# Patient Record
Sex: Female | Born: 1970
Health system: Southern US, Community
[De-identification: ages and names within clinical notes are randomized; demographics above are authoritative.]

## PROBLEM LIST (undated history)

## (undated) DIAGNOSIS — N6320 Unspecified lump in the left breast, unspecified quadrant: Secondary | ICD-10-CM

## (undated) DIAGNOSIS — K2100 Gastro-esophageal reflux disease with esophagitis, without bleeding: Secondary | ICD-10-CM

## (undated) DIAGNOSIS — N951 Menopausal and female climacteric states: Secondary | ICD-10-CM

## (undated) DIAGNOSIS — M1990 Unspecified osteoarthritis, unspecified site: Secondary | ICD-10-CM

## (undated) DIAGNOSIS — I10 Essential (primary) hypertension: Secondary | ICD-10-CM

## (undated) DIAGNOSIS — Z8719 Personal history of other diseases of the digestive system: Secondary | ICD-10-CM

## (undated) DIAGNOSIS — D352 Benign neoplasm of pituitary gland: Secondary | ICD-10-CM

## (undated) HISTORY — DX: Benign neoplasm of pituitary gland: D35.2

## (undated) HISTORY — DX: Unspecified osteoarthritis, unspecified site: M19.90

## (undated) HISTORY — DX: Unspecified lump in the left breast, unspecified quadrant: N63.20

## (undated) HISTORY — PX: LAPAROSCOPIC GASTRIC BANDING: SHX1100

## (undated) HISTORY — DX: Gastro-esophageal reflux disease with esophagitis, without bleeding: K21.00

## (undated) HISTORY — DX: Essential (primary) hypertension: I10

## (undated) HISTORY — PX: TUBAL LIGATION: SHX77

## (undated) HISTORY — PX: TONSILLECTOMY AND ADENOIDECTOMY: SUR1326

## (undated) HISTORY — DX: Personal history of other diseases of the digestive system: Z87.19

## (undated) HISTORY — PX: CERVICAL BIOPSY  W/ LOOP ELECTRODE EXCISION: SUR135

## (undated) HISTORY — DX: Menopausal and female climacteric states: N95.1

---

## 2010-02-19 HISTORY — PX: LAPAROSCOPIC GASTRIC BANDING: SHX1100

## 2013-03-24 DIAGNOSIS — I10 Essential (primary) hypertension: Secondary | ICD-10-CM | POA: Insufficient documentation

## 2013-03-24 DIAGNOSIS — D259 Leiomyoma of uterus, unspecified: Secondary | ICD-10-CM | POA: Insufficient documentation

## 2013-03-24 DIAGNOSIS — A6004 Herpesviral vulvovaginitis: Secondary | ICD-10-CM | POA: Insufficient documentation

## 2017-02-19 HISTORY — PX: CERVICAL BIOPSY  W/ LOOP ELECTRODE EXCISION: SUR135

## 2018-02-26 DIAGNOSIS — E559 Vitamin D deficiency, unspecified: Secondary | ICD-10-CM | POA: Insufficient documentation

## 2018-02-26 DIAGNOSIS — D352 Benign neoplasm of pituitary gland: Secondary | ICD-10-CM | POA: Insufficient documentation

## 2018-03-12 DIAGNOSIS — Z9884 Bariatric surgery status: Secondary | ICD-10-CM | POA: Insufficient documentation

## 2018-10-29 DIAGNOSIS — M25562 Pain in left knee: Secondary | ICD-10-CM | POA: Insufficient documentation

## 2018-10-29 DIAGNOSIS — M170 Bilateral primary osteoarthritis of knee: Secondary | ICD-10-CM | POA: Insufficient documentation

## 2018-10-29 DIAGNOSIS — M25561 Pain in right knee: Secondary | ICD-10-CM | POA: Insufficient documentation

## 2020-04-27 ENCOUNTER — Other Ambulatory Visit: Payer: Self-pay

## 2020-04-28 ENCOUNTER — Other Ambulatory Visit: Payer: Self-pay

## 2020-04-28 ENCOUNTER — Other Ambulatory Visit (HOSPITAL_COMMUNITY): Payer: Self-pay | Admitting: Podiatry

## 2020-04-28 ENCOUNTER — Ambulatory Visit (INDEPENDENT_AMBULATORY_CARE_PROVIDER_SITE_OTHER): Payer: 59

## 2020-04-28 ENCOUNTER — Ambulatory Visit: Payer: 59 | Admitting: Podiatry

## 2020-04-28 DIAGNOSIS — M79671 Pain in right foot: Secondary | ICD-10-CM

## 2020-04-28 DIAGNOSIS — M7731 Calcaneal spur, right foot: Secondary | ICD-10-CM

## 2020-04-28 DIAGNOSIS — M722 Plantar fascial fibromatosis: Secondary | ICD-10-CM

## 2020-04-28 MED ORDER — MELOXICAM 15 MG PO TABS: 15.0000 mg | ORAL_TABLET | Freq: Every day | ORAL | 3 refills | Status: DC

## 2020-04-28 MED FILL — MELOXICAM 15 MG TABLET: 15 | 30 days supply | Qty: 30 | Fill #0

## 2020-04-29 DIAGNOSIS — M79671 Pain in right foot: Secondary | ICD-10-CM | POA: Diagnosis not present

## 2020-04-29 DIAGNOSIS — M722 Plantar fascial fibromatosis: Secondary | ICD-10-CM | POA: Diagnosis not present

## 2020-04-29 DIAGNOSIS — M7731 Calcaneal spur, right foot: Secondary | ICD-10-CM | POA: Diagnosis not present

## 2020-04-29 MED ORDER — TRIAMCINOLONE ACETONIDE 40 MG/ML IJ SUSP
10.0000 mg | Freq: Once | INTRAMUSCULAR | Status: AC
Start: 2020-04-29 — End: 2020-04-29
  Administered 2020-04-29: 10 mg

## 2020-04-29 MED ORDER — DEXAMETHASONE SODIUM PHOSPHATE 4 MG/ML IJ SOLN
4.0000 mg | Freq: Once | INTRAMUSCULAR | Status: AC
  Administered 2020-04-29: 4 mg

## 2020-04-29 NOTE — Progress Notes (Signed)
  Subjective:  Patient ID: Alice Silva, female    DOB: 10-13-1970,  MRN: [id number]  Chief Complaint  Patient presents with  . Foot Pain    Right heel pain more on the lateral side of her foot. PT stated that she has had this pain since January it is a constant pain that is sharp and dull and achy     50 y.o. female presents with the above complaint. History confirmed with patient.  She works as a Marine scientist at [employer].  The pain in the right heel started on the lateral side of the foot since January.  Describes it as sharp pain while walking.  She has had plantar fasciitis for does not feel like this is the same thing.  Objective:  Physical Exam: warm, good capillary refill, no trophic changes or ulcerative lesions, normal DP and PT pulses and normal sensory exam.   Right Foot: She has pain on palpation to the plantar lateral calcaneal tubercle at the insertion of the lateral band of the plantar fascia, no pain in the mid arch, no posterior heel pain, no pain with calcaneal squeeze.   Radiographs: X-ray of the right foot: no fracture, dislocation, swelling or degenerative changes noted, plantar calcaneal spur and posterior calcaneal spur Assessment:   1. Pain of right heel   2. Plantar fasciitis of right foot   3. Calcaneal spur of right foot      Plan:  Patient was evaluated and treated and all questions answered.  Discussed the etiology and treatment options for plantar fasciitis including stretching, formal physical therapy, supportive shoegears such as a running shoe or sneaker, pre fabricated orthoses, injection therapy, and oral medications. We also discussed the role of surgical treatment of this for patients who do not improve after exhausting non-surgical treatment options.  Her plantar fasciitis symptoms are not typical though I do think this is the most likely diagnosis on the lateral band insertion on the calcaneus.  It is possible that she is experiencing significant bony edema  within the large spur that she has.  Low threshold to order MRI at next visit if she has not had improvement.   -XR reviewed with patient -Educated patient on stretching and icing of the affected limb -Plantar fascial brace dispensed -Injection delivered to the plantar fascia of the right foot. -Rx for meloxicam. Educated on use, risks and benefits of the medication.  She has had issues with a blood pressure with NSAIDs before I cautioned her to watch for this.  Return in about 4 weeks (around 05/26/2020) for re-check right heel pain.

## 2020-05-26 DIAGNOSIS — N920 Excessive and frequent menstruation with regular cycle: Secondary | ICD-10-CM | POA: Diagnosis not present

## 2020-05-26 DIAGNOSIS — Z7689 Persons encountering health services in other specified circumstances: Secondary | ICD-10-CM | POA: Diagnosis not present

## 2020-06-02 ENCOUNTER — Ambulatory Visit: Payer: 59 | Admitting: Podiatry

## 2020-06-02 ENCOUNTER — Encounter: Payer: Self-pay | Admitting: Podiatry

## 2020-06-02 ENCOUNTER — Other Ambulatory Visit: Payer: Self-pay

## 2020-06-02 DIAGNOSIS — M7731 Calcaneal spur, right foot: Secondary | ICD-10-CM | POA: Diagnosis not present

## 2020-06-02 DIAGNOSIS — M722 Plantar fascial fibromatosis: Secondary | ICD-10-CM | POA: Diagnosis not present

## 2020-06-02 NOTE — Patient Instructions (Signed)
Continue the stretching exercises. Take motrin as needed

## 2020-06-02 NOTE — Progress Notes (Signed)
  Subjective:  Patient ID: Alice Silva, female    DOB: 11-24-1970,  MRN: [id number]  Chief Complaint  Patient presents with  . Follow-up    Follow up rt heel pain-showing some improvement-resting and wearing cam boot-meloxicam did not help, taking ibuprofen instead    50 y.o. female returns with the above complaint. History confirmed with patient. She is doing much better, the injection helped. Does not think the meloxicam was helpful. Boot has been helpful   Objective:  Physical Exam: warm, good capillary refill, no trophic changes or ulcerative lesions, normal DP and PT pulses and normal sensory exam.   Right Foot: No pain on palpation today    Radiographs: X-ray of the right foot: no fracture, dislocation, swelling or degenerative changes noted, plantar calcaneal spur and posterior calcaneal spur Assessment:   1. Plantar fasciitis of right foot   2. Calcaneal spur of right foot      Plan:  Patient was evaluated and treated and all questions answered.  -Doing much better. - no injection today - continue CAM boot and wean over next week - Repeat injection if necessary - PRN motrin  - continue stretching and icing regimen - Long term would benefit from [facility], she will consider this for the future   Return in about 6 weeks (around 07/14/2020), or if symptoms worsen or fail to improve, for recheck plantar fasciitis.

## 2020-06-27 ENCOUNTER — Encounter: Payer: Self-pay | Admitting: Nurse Practitioner

## 2020-06-27 ENCOUNTER — Other Ambulatory Visit (HOSPITAL_BASED_OUTPATIENT_CLINIC_OR_DEPARTMENT_OTHER): Payer: Self-pay

## 2020-06-27 ENCOUNTER — Ambulatory Visit (INDEPENDENT_AMBULATORY_CARE_PROVIDER_SITE_OTHER): Payer: 59 | Admitting: Nurse Practitioner

## 2020-06-27 ENCOUNTER — Other Ambulatory Visit: Payer: Self-pay

## 2020-06-27 VITALS — BP 142/90 | HR 96 | Temp 99.0°F | Ht 64.25 in | Wt 365.0 lb

## 2020-06-27 DIAGNOSIS — Z0001 Encounter for general adult medical examination with abnormal findings: Secondary | ICD-10-CM | POA: Diagnosis not present

## 2020-06-27 DIAGNOSIS — Z1211 Encounter for screening for malignant neoplasm of colon: Secondary | ICD-10-CM | POA: Diagnosis not present

## 2020-06-27 DIAGNOSIS — Z86018 Personal history of other benign neoplasm: Secondary | ICD-10-CM | POA: Insufficient documentation

## 2020-06-27 DIAGNOSIS — M545 Low back pain, unspecified: Secondary | ICD-10-CM | POA: Diagnosis not present

## 2020-06-27 DIAGNOSIS — I10 Essential (primary) hypertension: Secondary | ICD-10-CM | POA: Diagnosis not present

## 2020-06-27 DIAGNOSIS — E559 Vitamin D deficiency, unspecified: Secondary | ICD-10-CM

## 2020-06-27 DIAGNOSIS — Z13220 Encounter for screening for lipoid disorders: Secondary | ICD-10-CM | POA: Diagnosis not present

## 2020-06-27 DIAGNOSIS — G8929 Other chronic pain: Secondary | ICD-10-CM

## 2020-06-27 DIAGNOSIS — Z136 Encounter for screening for cardiovascular disorders: Secondary | ICD-10-CM | POA: Diagnosis not present

## 2020-06-27 LAB — COMPREHENSIVE METABOLIC PANEL
ALT: 14 U/L (ref 0–35)
AST: 15 U/L (ref 0–37)
Albumin: 4.1 g/dL (ref 3.5–5.2)
Alkaline Phosphatase: 79 U/L (ref 39–117)
BUN: 12 mg/dL (ref 6–23)
CO2: 30 mEq/L (ref 19–32)
Calcium: 9.8 mg/dL (ref 8.4–10.5)
Chloride: 100 mEq/L (ref 96–112)
Creatinine, Ser: 0.89 mg/dL (ref 0.40–1.20)
GFR: 76.75 mL/min (ref >60.00)
Glucose, Bld: 97 mg/dL (ref 70–99)
Potassium: 4.2 mEq/L (ref 3.5–5.1)
Sodium: 139 mEq/L (ref 135–145)
Total Bilirubin: 0.4 mg/dL (ref 0.2–1.2)
Total Protein: 7.3 g/dL (ref 6.0–8.3)

## 2020-06-27 LAB — TSH: TSH: 0.92 u[IU]/mL (ref 0.35–4.50)

## 2020-06-27 LAB — CBC WITH DIFFERENTIAL/PLATELET
Basophils Absolute: 0 10*3/uL (ref 0.0–0.1)
Basophils Relative: 0.7 % (ref 0.0–3.0)
Eosinophils Absolute: 0.1 10*3/uL (ref 0.0–0.7)
Eosinophils Relative: 1.3 % (ref 0.0–5.0)
HCT: 40.8 % (ref 36.0–46.0)
Hemoglobin: 13.4 g/dL (ref 12.0–15.0)
Lymphocytes Relative: 22.9 % (ref 12.0–46.0)
Lymphs Abs: 1.5 10*3/uL (ref 0.7–4.0)
MCHC: 33 g/dL (ref 30.0–36.0)
MCV: 86.8 fl (ref 78.0–100.0)
Monocytes Absolute: 0.5 10*3/uL (ref 0.1–1.0)
Monocytes Relative: 6.9 % (ref 3.0–12.0)
Neutro Abs: 4.4 10*3/uL (ref 1.4–7.7)
Neutrophils Relative %: 68.2 % (ref 43.0–77.0)
Platelets: 366 10*3/uL (ref 150.0–400.0)
RBC: 4.7 Mil/uL (ref 3.87–5.11)
RDW: 14.9 % (ref 11.5–15.5)
WBC: 6.5 10*3/uL (ref 4.0–10.5)

## 2020-06-27 LAB — LIPID PANEL
Cholesterol: 220 mg/dL — ABNORMAL HIGH (ref 0–200)
HDL: 67.2 mg/dL (ref >39.00)
LDL Cholesterol: 135 mg/dL — ABNORMAL HIGH (ref 0–99)
NonHDL: 152.51
Total CHOL/HDL Ratio: 3
Triglycerides: 87 mg/dL (ref 0.0–149.0)
VLDL: 17.4 mg/dL (ref 0.0–40.0)

## 2020-06-27 MED ORDER — CYCLOBENZAPRINE HCL 10 MG PO TABS
10.0000 mg | ORAL_TABLET | Freq: Every day | ORAL | 0 refills | Status: DC
  Filled 2020-06-27: qty 30, 30d supply, fill #0

## 2020-06-27 MED ORDER — CYCLOBENZAPRINE HCL 10 MG PO TABS
10.0000 mg | ORAL_TABLET | Freq: Three times a day (TID) | ORAL | 0 refills | Status: DC | PRN
Start: 2020-06-27 — End: 2020-06-27
  Filled 2020-06-27: qty 30, 10d supply, fill #0

## 2020-06-27 MED ORDER — HYDROCHLOROTHIAZIDE 25 MG PO TABS
1.0000 | ORAL_TABLET | Freq: Every day | ORAL | 11 refills | Status: DC
Start: 2020-06-27 — End: 2021-03-27
  Filled 2020-06-27: qty 30, 30d supply, fill #0
  Filled 2020-09-23: qty 90, 90d supply, fill #1

## 2020-06-27 NOTE — Assessment & Plan Note (Signed)
Possible white coat syndrome? Use of HCTZ as needed Reports home BP: 120/80 No LE edema or chest pain or dizziness or headaches. BP Readings from Last 3 Encounters:  06/27/20 (!) 142/90   Repeat BMP Maintain HCTZ dose Advised to continue BP check at home 2-3x/week in AM.

## 2020-06-27 NOTE — Assessment & Plan Note (Signed)
S/p lab band 2012, lost 377 to 317 Released 04/2019 due to GERD, vomiting: now resolved Weight gain since release She plans to schedule appt with bariatric surgeon

## 2020-06-27 NOTE — Patient Instructions (Signed)
Thank you for choosing [facility] Go to lab for blood draw.  You will be contacted to schedule appt with GI If normal labs, will ref to endocrinology (Dr. Gweneth Fritter)  Preventive Care 8-48 Years Old, Female Preventive care refers to lifestyle choices and visits with your health care provider that can promote health and wellness. This includes:  A yearly physical exam. This is also called an annual wellness visit.  Regular dental and eye exams.  Immunizations.  Screening for certain conditions.  Healthy lifestyle choices, such as: ? Eating a healthy diet. ? Getting regular exercise. ? Not using drugs or products that contain nicotine and tobacco. ? Limiting alcohol use. What can I expect for my preventive care visit? Physical exam Your health care provider will check your:  Height and weight. These may be used to calculate your BMI (body mass index). BMI is a measurement that tells if you are at a healthy weight.  Heart rate and blood pressure.  Body temperature.  Skin for abnormal spots. Counseling Your health care provider may ask you questions about your:  Past medical problems.  Family's medical history.  Alcohol, tobacco, and drug use.  Emotional well-being.  Home life and relationship well-being.  Sexual activity.  Diet, exercise, and sleep habits.  Work and work Statistician.  Access to firearms.  Method of birth control.  Menstrual cycle.  Pregnancy history. What immunizations do I need? Vaccines are usually given at various ages, according to a schedule. Your health care provider will recommend vaccines for you based on your age, medical history, and lifestyle or other factors, such as travel or where you work.   What tests do I need? Blood tests  Lipid and cholesterol levels. These may be checked every 5 years, or more often if you are over 66 years old.  Hepatitis C test.  Hepatitis B test. Screening  Lung cancer  screening. You may have this screening every year starting at age 41 if you have a 30-pack-year history of smoking and currently smoke or have quit within the past 15 years.  Colorectal cancer screening. ? All adults should have this screening starting at age 56 and continuing until age 61. ? Your health care provider may recommend screening at age 81 if you are at increased risk. ? You will have tests every 1-10 years, depending on your results and the type of screening test.  Diabetes screening. ? This is done by checking your blood sugar (glucose) after you have not eaten for a while (fasting). ? You may have this done every 1-3 years.  Mammogram. ? This may be done every 1-2 years. ? Talk with your health care provider about when you should start having regular mammograms. This may depend on whether you have a family history of breast cancer.  BRCA-related cancer screening. This may be done if you have a family history of breast, ovarian, tubal, or peritoneal cancers.  Pelvic exam and Pap test. ? This may be done every 3 years starting at age 55. ? Starting at age 85, this may be done every 5 years if you have a Pap test in combination with an HPV test. Other tests  STD (sexually transmitted disease) testing, if you are at risk.  Bone density scan. This is done to screen for osteoporosis. You may have this scan if you are at high risk for osteoporosis. Talk with your health care provider about your test results, treatment options, and if necessary, the need for  more tests. Follow these instructions at home: Eating and drinking  Eat a diet that includes fresh fruits and vegetables, whole grains, lean protein, and low-fat dairy products.  Take vitamin and mineral supplements as recommended by your health care provider.  Do not drink alcohol if: ? Your health care provider tells you not to drink. ? You are pregnant, may be pregnant, or are planning to become pregnant.  If you  drink alcohol: ? Limit how much you have to 0-1 drink a day. ? Be aware of how much alcohol is in your drink. In the [place] one drink equals one 12 oz bottle of beer (355 mL), one 5 oz glass of wine (148 mL), or one 1 oz glass of hard liquor (44 mL).   Lifestyle  Take daily care of your teeth and gums. Brush your teeth every morning and night with fluoride toothpaste. Floss one time each day.  Stay active. Exercise for at least 30 minutes 5 or more days each week.  Do not use any products that contain nicotine or tobacco, such as cigarettes, e-cigarettes, and chewing tobacco. If you need help quitting, ask your health care provider.  Do not use drugs.  If you are sexually active, practice safe sex. Use a condom or other form of protection to prevent STIs (sexually transmitted infections).  If you do not wish to become pregnant, use a form of birth control. If you plan to become pregnant, see your health care provider for a prepregnancy visit.  If told by your health care provider, take low-dose aspirin daily starting at age 79.  Find healthy ways to cope with stress, such as: ? Meditation, yoga, or listening to music. ? Journaling. ? Talking to a trusted person. ? Spending time with friends and family. Safety  Always wear your seat belt while driving or riding in a vehicle.  Do not drive: ? If you have been drinking alcohol. Do not ride with someone who has been drinking. ? When you are tired or distracted. ? While texting.  Wear a helmet and other protective equipment during sports activities.  If you have firearms in your house, make sure you follow all gun safety procedures. What's next?  Visit your health care provider once a year for an annual wellness visit.  Ask your health care provider how often you should have your eyes and teeth checked.  Stay up to date on all vaccines. This information is not intended to replace advice given to you by your health care provider.  Make sure you discuss any questions you have with your health care provider. Document Revised: 11/10/2019 Document Reviewed: 10/17/2017 Elsevier Patient Education  2021 [employer].

## 2020-06-27 NOTE — Assessment & Plan Note (Signed)
Diagnosed 2016. Has monthly menstrual cycle, persistent hot flashes, repeat weight gain despite small meal portions. Reports tumor decreased in size:0.53mm to 0.48mm, Last MRI brain 02/2019 Last OV with endocrinology 04/2019.  Endocrinology referral entered Repeat TSH, HgbA1c, prolactin

## 2020-06-27 NOTE — Assessment & Plan Note (Signed)
Due to weight and work duties: repeated bending and lifting. No weakness or paresthesia Use of flexeril prn

## 2020-06-27 NOTE — Progress Notes (Signed)
Subjective:    Patient ID: Alice Silva, female    DOB: February 06, 1971, 50 y.o.   MRN: [id number]  Patient presents today for CPE and eval of chronic conditions  HPI History of prolactinoma Diagnosed 2016. Has monthly menstrual cycle, persistent hot flashes, repeat weight gain despite small meal portions. Reports tumor decreased in size:0.26mm to 0.38mm, Last MRI brain 02/2019 Last OV with endocrinology 04/2019.  Endocrinology referral entered Repeat TSH, HgbA1c, prolactin   LAP-BAND surgery status S/p lab band 2012, lost 377 to 317 Released 04/2019 due to GERD, vomiting: now resolved Weight gain since release She plans to schedule appt with bariatric surgeon   Chronic back pain Due to weight and work duties: repeated bending and lifting. No weakness or paresthesia Use of flexeril prn  Essential hypertension Possible white coat syndrome? Use of HCTZ as needed Reports home BP: 120/80 No LE edema or chest pain or dizziness or headaches. BP Readings from Last 3 Encounters:  06/27/20 (!) 142/90   Repeat BMP Maintain HCTZ dose Advised to continue BP check at home 2-3x/week in AM.  Sexual History (orientation,birth control, marital status, STD): has upcoming appt with GYN: Dr. Charlesetta Garibaldi [facility])  Vision:up to date  Dental:up to date  Immunizations: (TDAP, Hep C screen, Pneumovax, Influenza, zoster)  Health Maintenance  Topic Date Due  . Pap Smear  Never done  . Colon Cancer Screening  Never done  . COVID-19 Vaccine (3 - Booster for Pfizer series) 08/27/2019  . Hepatitis C Screening: USPSTF Recommendation to screen - Ages 18-79 yo.  06/27/2021*  . HIV Screening  06/27/2021*  . Flu Shot  09/19/2020  . Tetanus Vaccine  10/21/2022  . HPV Vaccine  Aged Out  *Topic was postponed. The date shown is not the original due date.   Diet:hear healthy Exercise:limited due to knee pain Weight:  Wt Readings from Last 3 Encounters:  06/27/20 (!) 365 lb (165.6 kg)     Medications and allergies reviewed with patient and updated if appropriate.  Patient Active Problem List   Diagnosis Date Noted  . Chronic back pain 06/27/2020  . History of prolactinoma 06/27/2020  . Pain in both knees 10/29/2018  . Primary osteoarthritis of both knees 10/29/2018  . LAP-BAND surgery status 03/12/2018  . Pituitary microadenoma ([place]) 02/26/2018  . Vitamin D deficiency 02/26/2018  . Essential hypertension 03/24/2013  . Morbid obesity ([place]) 03/24/2013  . Uterine leiomyoma 03/24/2013    Current Outpatient Medications on File Prior to Visit  Medication Sig Dispense Refill  . Garlic 3716 MG CAPS     . Multiple Vitamin (MULTIVITAMIN ADULT PO)     . Probiotic Product (PROBIOTIC PEARLS) CAPS      No current facility-administered medications on file prior to visit.    Past Medical History:  Diagnosis Date  . Arthritis   . Hypertension     Past Surgical History:  Procedure Laterality Date  . LAPAROSCOPIC GASTRIC BANDING      Social History   Socioeconomic History  . Marital status: Single    Spouse name: Not on file  . Number of children: 1  . Years of education: Not on file  . Highest education level: Not on file  Occupational History    Comment: RN- ICU  Tobacco Use  . Smoking status: Never Smoker  . Smokeless tobacco: Never Used  Substance and Sexual Activity  . Alcohol use: Not on file    Comment: social  . Drug use: Never  . Sexual activity: Yes  Birth control/protection: Surgical    Comment: tubal ligation  Other Topics Concern  . Not on file  Social History Narrative   Single, Boyfriend   daughter 92, garan son 7   Exercise: none   Social Determinants of Radio broadcast assistant Strain: Not on file  Food Insecurity: Not on file  Transportation Needs: Not on file  Physical Activity: Not on file  Stress: Not on file  Social Connections: Not on file    Family History  Problem Relation Age of Onset  . Hyperlipidemia Mother    . Depression Mother   . Hyperlipidemia Father   . Alcohol abuse Sister        cirrhosis  . Dementia Maternal Uncle 60  . Cancer Paternal Aunt 11       Breast  . Cancer Maternal Grandmother 44       pancreatic  . Stroke Paternal Grandmother   . Heart disease Paternal Grandmother 87       MI       Review of Systems  Constitutional: Negative for fever, malaise/fatigue and weight loss.  HENT: Negative for congestion and sore throat.   Eyes:       Negative for visual changes  Respiratory: Negative for cough and shortness of breath.   Cardiovascular: Negative for chest pain, palpitations and leg swelling.  Gastrointestinal: Negative for blood in stool, constipation, diarrhea and heartburn.  Genitourinary: Negative for dysuria, frequency and urgency.  Musculoskeletal: Negative for falls, joint pain and myalgias.  Skin: Negative for rash.  Neurological: Negative for dizziness, sensory change and headaches.  Endo/Heme/Allergies: Does not bruise/bleed easily.  Psychiatric/Behavioral: Negative for depression, substance abuse and suicidal ideas. The patient is not nervous/anxious.    Objective:   Vitals:   06/27/20 1041  BP: (!) 142/90  Pulse: 96  Temp: 99 F (37.2 C)  SpO2: 100%   Body mass index is 62.17 kg/m.  Physical Examination:  Physical Exam Vitals reviewed.  Constitutional:      General: She is not in acute distress.    Appearance: She is well-developed. She is obese.  HENT:     Right Ear: Tympanic membrane, ear canal and external ear normal.     Left Ear: Tympanic membrane, ear canal and external ear normal.  Eyes:     Extraocular Movements: Extraocular movements intact.     Conjunctiva/sclera: Conjunctivae normal.  Cardiovascular:     Rate and Rhythm: Normal rate and regular rhythm.     Pulses: Normal pulses.     Heart sounds: Normal heart sounds.  Pulmonary:     Effort: Pulmonary effort is normal. No respiratory distress.     Breath sounds: Normal  breath sounds.  Chest:     Chest wall: No tenderness.  Abdominal:     General: Bowel sounds are normal.     Palpations: Abdomen is soft.  Musculoskeletal:        General: Normal range of motion.     Cervical back: Normal range of motion and neck supple.     Right lower leg: No edema.     Left lower leg: No edema.  Lymphadenopathy:     Cervical: No cervical adenopathy.  Skin:    General: Skin is warm and dry.  Neurological:     Mental Status: She is alert and oriented to person, place, and time.     Deep Tendon Reflexes: Reflexes are normal and symmetric.  Psychiatric:        Mood and Affect:  Mood normal.        Behavior: Behavior normal.        Thought Content: Thought content normal.    ASSESSMENT and PLAN: This visit occurred during the SARS-CoV-2 public health emergency.  Safety protocols were in place, including screening questions prior to the visit, additional usage of staff PPE, and extensive cleaning of exam room while observing appropriate contact time as indicated for disinfecting solutions.   Alice Silva was seen today for establish care.  Diagnoses and all orders for this visit:  Encounter for preventative adult health care exam with abnormal findings -     CBC with Differential/Platelet -     Comprehensive metabolic panel -     Lipid panel  Essential hypertension -     hydrochlorothiazide (HYDRODIURIL) 25 MG tablet; Take 1 tablet (25 mg total) by mouth daily.  Encounter for lipid screening for cardiovascular disease -     Lipid panel  Morbid obesity ([place]) -     TSH -     Ambulatory referral to Endocrinology  History of prolactinoma -     Prolactin -     Ambulatory referral to Endocrinology  Colon cancer screening -     Ambulatory referral to Gastroenterology  Vitamin D deficiency -     Vitamin D 1,25 dihydroxy  Chronic bilateral low back pain without sciatica -     Discontinue: cyclobenzaprine (FLEXERIL) 10 MG tablet; Take 1 tablet (10 mg total) by  mouth 3 (three) times daily as needed for muscle spasms. -     cyclobenzaprine (FLEXERIL) 10 MG tablet; Take 1 tablet (10 mg total) by mouth at bedtime.      Problem List Items Addressed This Visit      Cardiovascular and Mediastinum   Essential hypertension    Possible white coat syndrome? Use of HCTZ as needed Reports home BP: 120/80 No LE edema or chest pain or dizziness or headaches. BP Readings from Last 3 Encounters:  06/27/20 (!) 142/90   Repeat BMP Maintain HCTZ dose Advised to continue BP check at home 2-3x/week in AM.      Relevant Medications   hydrochlorothiazide (HYDRODIURIL) 25 MG tablet     Other   Chronic back pain    Due to weight and work duties: repeated bending and lifting. No weakness or paresthesia Use of flexeril prn      Relevant Medications   cyclobenzaprine (FLEXERIL) 10 MG tablet   History of prolactinoma    Diagnosed 2016. Has monthly menstrual cycle, persistent hot flashes, repeat weight gain despite small meal portions. Reports tumor decreased in size:0.46mm to 0.36mm, Last MRI brain 02/2019 Last OV with endocrinology 04/2019.  Endocrinology referral entered Repeat TSH, HgbA1c, prolactin       Relevant Orders   Prolactin   Ambulatory referral to Endocrinology   Morbid obesity ([place])   Relevant Orders   TSH (Completed)   Ambulatory referral to Endocrinology   Vitamin D deficiency   Relevant Orders   Vitamin D 1,25 dihydroxy    Other Visit Diagnoses    Encounter for preventative adult health care exam with abnormal findings    -  Primary   Relevant Orders   CBC with Differential/Platelet (Completed)   Comprehensive metabolic panel (Completed)   Lipid panel (Completed)   Encounter for lipid screening for cardiovascular disease       Relevant Orders   Lipid panel (Completed)   Colon cancer screening       Relevant Orders   Ambulatory  referral to Gastroenterology      Follow up: Return in about 3 months (around 09/27/2020) for  HTN.  Wilfred Lacy, NP

## 2020-06-28 ENCOUNTER — Encounter: Payer: Self-pay | Admitting: Nurse Practitioner

## 2020-06-30 LAB — VITAMIN D 1,25 DIHYDROXY
Vitamin D 1, 25 (OH)2 Total: 34 pg/mL (ref 18–72)
Vitamin D2 1, 25 (OH)2: <8 pg/mL
Vitamin D3 1, 25 (OH)2: 34 pg/mL

## 2020-06-30 LAB — PROLACTIN: Prolactin: 30.1 ng/mL — ABNORMAL HIGH

## 2020-07-19 ENCOUNTER — Encounter: Payer: Self-pay | Admitting: Endocrinology

## 2020-07-25 ENCOUNTER — Ambulatory Visit: Payer: 59 | Admitting: Podiatry

## 2020-09-20 DIAGNOSIS — M170 Bilateral primary osteoarthritis of knee: Secondary | ICD-10-CM | POA: Insufficient documentation

## 2020-09-20 DIAGNOSIS — M25561 Pain in right knee: Secondary | ICD-10-CM | POA: Diagnosis not present

## 2020-09-20 DIAGNOSIS — M25562 Pain in left knee: Secondary | ICD-10-CM | POA: Diagnosis not present

## 2020-09-23 ENCOUNTER — Other Ambulatory Visit (HOSPITAL_BASED_OUTPATIENT_CLINIC_OR_DEPARTMENT_OTHER): Payer: Self-pay

## 2020-10-10 DIAGNOSIS — M25561 Pain in right knee: Secondary | ICD-10-CM | POA: Diagnosis not present

## 2020-10-10 DIAGNOSIS — M5451 Vertebrogenic low back pain: Secondary | ICD-10-CM | POA: Diagnosis not present

## 2020-10-10 DIAGNOSIS — M25562 Pain in left knee: Secondary | ICD-10-CM | POA: Diagnosis not present

## 2020-10-10 DIAGNOSIS — M545 Low back pain, unspecified: Secondary | ICD-10-CM | POA: Diagnosis not present

## 2020-10-13 ENCOUNTER — Other Ambulatory Visit (HOSPITAL_BASED_OUTPATIENT_CLINIC_OR_DEPARTMENT_OTHER): Payer: Self-pay

## 2020-10-13 DIAGNOSIS — Z9884 Bariatric surgery status: Secondary | ICD-10-CM | POA: Diagnosis not present

## 2020-10-13 DIAGNOSIS — K219 Gastro-esophageal reflux disease without esophagitis: Secondary | ICD-10-CM | POA: Diagnosis not present

## 2020-10-13 DIAGNOSIS — M545 Low back pain, unspecified: Secondary | ICD-10-CM | POA: Diagnosis not present

## 2020-10-13 DIAGNOSIS — M25561 Pain in right knee: Secondary | ICD-10-CM | POA: Diagnosis not present

## 2020-10-13 DIAGNOSIS — M25562 Pain in left knee: Secondary | ICD-10-CM | POA: Diagnosis not present

## 2020-10-13 DIAGNOSIS — M5451 Vertebrogenic low back pain: Secondary | ICD-10-CM | POA: Diagnosis not present

## 2020-10-13 MED ORDER — PHENTERMINE HCL 37.5 MG PO TABS
ORAL_TABLET | ORAL | 0 refills | Status: DC
  Filled 2020-10-13 – 2020-11-09 (×2): qty 30, 30d supply, fill #0

## 2020-10-17 ENCOUNTER — Other Ambulatory Visit: Payer: Self-pay | Admitting: Gastroenterology

## 2020-10-17 DIAGNOSIS — Z1211 Encounter for screening for malignant neoplasm of colon: Secondary | ICD-10-CM | POA: Diagnosis not present

## 2020-10-18 ENCOUNTER — Other Ambulatory Visit: Payer: Self-pay | Admitting: Student

## 2020-10-18 DIAGNOSIS — Z9884 Bariatric surgery status: Secondary | ICD-10-CM

## 2020-10-21 DIAGNOSIS — Z6841 Body Mass Index (BMI) 40.0 and over, adult: Secondary | ICD-10-CM | POA: Diagnosis not present

## 2020-10-21 DIAGNOSIS — I517 Cardiomegaly: Secondary | ICD-10-CM | POA: Diagnosis not present

## 2020-10-21 DIAGNOSIS — Z79899 Other long term (current) drug therapy: Secondary | ICD-10-CM | POA: Diagnosis not present

## 2020-10-21 DIAGNOSIS — Z1339 Encounter for screening examination for other mental health and behavioral disorders: Secondary | ICD-10-CM | POA: Diagnosis not present

## 2020-10-21 DIAGNOSIS — Z0000 Encounter for general adult medical examination without abnormal findings: Secondary | ICD-10-CM | POA: Diagnosis not present

## 2020-10-21 DIAGNOSIS — R0602 Shortness of breath: Secondary | ICD-10-CM | POA: Diagnosis not present

## 2020-10-21 DIAGNOSIS — Z9884 Bariatric surgery status: Secondary | ICD-10-CM | POA: Diagnosis not present

## 2020-10-21 DIAGNOSIS — I10 Essential (primary) hypertension: Secondary | ICD-10-CM | POA: Diagnosis not present

## 2020-10-21 DIAGNOSIS — Z1159 Encounter for screening for other viral diseases: Secondary | ICD-10-CM | POA: Diagnosis not present

## 2020-10-21 DIAGNOSIS — Z711 Person with feared health complaint in whom no diagnosis is made: Secondary | ICD-10-CM | POA: Diagnosis not present

## 2020-10-21 DIAGNOSIS — Z1331 Encounter for screening for depression: Secondary | ICD-10-CM | POA: Diagnosis not present

## 2020-10-21 DIAGNOSIS — M170 Bilateral primary osteoarthritis of knee: Secondary | ICD-10-CM | POA: Diagnosis not present

## 2020-10-26 DIAGNOSIS — M25562 Pain in left knee: Secondary | ICD-10-CM | POA: Diagnosis not present

## 2020-10-26 DIAGNOSIS — M5451 Vertebrogenic low back pain: Secondary | ICD-10-CM | POA: Diagnosis not present

## 2020-10-26 DIAGNOSIS — M25561 Pain in right knee: Secondary | ICD-10-CM | POA: Diagnosis not present

## 2020-10-26 DIAGNOSIS — M545 Low back pain, unspecified: Secondary | ICD-10-CM | POA: Diagnosis not present

## 2020-10-27 ENCOUNTER — Other Ambulatory Visit (HOSPITAL_BASED_OUTPATIENT_CLINIC_OR_DEPARTMENT_OTHER): Payer: Self-pay

## 2020-11-09 ENCOUNTER — Other Ambulatory Visit: Payer: Self-pay | Admitting: Student

## 2020-11-09 ENCOUNTER — Ambulatory Visit
Admission: RE | Admit: 2020-11-09 | Discharge: 2020-11-09 | Disposition: A | Discharge status: Home / Self Care | Payer: 59 | Source: Ambulatory Visit | Attending: Student | Admitting: Student

## 2020-11-09 ENCOUNTER — Other Ambulatory Visit (HOSPITAL_BASED_OUTPATIENT_CLINIC_OR_DEPARTMENT_OTHER): Payer: Self-pay

## 2020-11-09 DIAGNOSIS — M5451 Vertebrogenic low back pain: Secondary | ICD-10-CM | POA: Diagnosis not present

## 2020-11-09 DIAGNOSIS — K219 Gastro-esophageal reflux disease without esophagitis: Secondary | ICD-10-CM | POA: Diagnosis not present

## 2020-11-09 DIAGNOSIS — Z9884 Bariatric surgery status: Secondary | ICD-10-CM

## 2020-11-09 DIAGNOSIS — M545 Low back pain, unspecified: Secondary | ICD-10-CM | POA: Diagnosis not present

## 2020-11-09 DIAGNOSIS — M25561 Pain in right knee: Secondary | ICD-10-CM | POA: Diagnosis not present

## 2020-11-09 DIAGNOSIS — M25562 Pain in left knee: Secondary | ICD-10-CM | POA: Diagnosis not present

## 2020-11-16 DIAGNOSIS — M5451 Vertebrogenic low back pain: Secondary | ICD-10-CM | POA: Diagnosis not present

## 2020-11-16 DIAGNOSIS — M25561 Pain in right knee: Secondary | ICD-10-CM | POA: Diagnosis not present

## 2020-11-16 DIAGNOSIS — M545 Low back pain, unspecified: Secondary | ICD-10-CM | POA: Diagnosis not present

## 2020-11-16 DIAGNOSIS — M25562 Pain in left knee: Secondary | ICD-10-CM | POA: Diagnosis not present

## 2020-11-21 DIAGNOSIS — Z113 Encounter for screening for infections with a predominantly sexual mode of transmission: Secondary | ICD-10-CM | POA: Diagnosis not present

## 2020-11-21 DIAGNOSIS — Z124 Encounter for screening for malignant neoplasm of cervix: Secondary | ICD-10-CM | POA: Diagnosis not present

## 2020-11-21 DIAGNOSIS — D069 Carcinoma in situ of cervix, unspecified: Secondary | ICD-10-CM | POA: Diagnosis not present

## 2020-11-21 DIAGNOSIS — Z1231 Encounter for screening mammogram for malignant neoplasm of breast: Secondary | ICD-10-CM | POA: Diagnosis not present

## 2020-11-21 DIAGNOSIS — Z01419 Encounter for gynecological examination (general) (routine) without abnormal findings: Secondary | ICD-10-CM | POA: Diagnosis not present

## 2020-11-22 DIAGNOSIS — M5451 Vertebrogenic low back pain: Secondary | ICD-10-CM | POA: Diagnosis not present

## 2020-11-22 DIAGNOSIS — M25562 Pain in left knee: Secondary | ICD-10-CM | POA: Diagnosis not present

## 2020-11-22 DIAGNOSIS — M25561 Pain in right knee: Secondary | ICD-10-CM | POA: Diagnosis not present

## 2020-11-22 DIAGNOSIS — M545 Low back pain, unspecified: Secondary | ICD-10-CM | POA: Diagnosis not present

## 2020-12-05 DIAGNOSIS — M25561 Pain in right knee: Secondary | ICD-10-CM | POA: Diagnosis not present

## 2020-12-05 DIAGNOSIS — M545 Low back pain, unspecified: Secondary | ICD-10-CM | POA: Diagnosis not present

## 2020-12-05 DIAGNOSIS — M5451 Vertebrogenic low back pain: Secondary | ICD-10-CM | POA: Diagnosis not present

## 2020-12-05 DIAGNOSIS — M25562 Pain in left knee: Secondary | ICD-10-CM | POA: Diagnosis not present

## 2020-12-08 ENCOUNTER — Other Ambulatory Visit: Payer: Self-pay | Admitting: Obstetrics and Gynecology

## 2020-12-08 DIAGNOSIS — R928 Other abnormal and inconclusive findings on diagnostic imaging of breast: Secondary | ICD-10-CM

## 2020-12-13 DIAGNOSIS — M545 Low back pain, unspecified: Secondary | ICD-10-CM | POA: Diagnosis not present

## 2020-12-13 DIAGNOSIS — M25561 Pain in right knee: Secondary | ICD-10-CM | POA: Diagnosis not present

## 2020-12-13 DIAGNOSIS — M25562 Pain in left knee: Secondary | ICD-10-CM | POA: Diagnosis not present

## 2020-12-13 DIAGNOSIS — M5451 Vertebrogenic low back pain: Secondary | ICD-10-CM | POA: Diagnosis not present

## 2020-12-19 DIAGNOSIS — M25562 Pain in left knee: Secondary | ICD-10-CM | POA: Diagnosis not present

## 2020-12-19 DIAGNOSIS — M5451 Vertebrogenic low back pain: Secondary | ICD-10-CM | POA: Diagnosis not present

## 2020-12-19 DIAGNOSIS — M25561 Pain in right knee: Secondary | ICD-10-CM | POA: Diagnosis not present

## 2020-12-19 DIAGNOSIS — M545 Low back pain, unspecified: Secondary | ICD-10-CM | POA: Diagnosis not present

## 2020-12-26 ENCOUNTER — Ambulatory Visit
Admission: RE | Admit: 2020-12-26 | Discharge: 2020-12-26 | Disposition: A | Discharge status: Home / Self Care | Payer: 59 | Source: Ambulatory Visit | Attending: Obstetrics and Gynecology | Admitting: Obstetrics and Gynecology

## 2020-12-26 ENCOUNTER — Other Ambulatory Visit: Payer: Self-pay

## 2020-12-26 DIAGNOSIS — R928 Other abnormal and inconclusive findings on diagnostic imaging of breast: Secondary | ICD-10-CM

## 2020-12-26 DIAGNOSIS — N6322 Unspecified lump in the left breast, upper inner quadrant: Secondary | ICD-10-CM | POA: Diagnosis not present

## 2020-12-27 DIAGNOSIS — M25561 Pain in right knee: Secondary | ICD-10-CM | POA: Diagnosis not present

## 2020-12-27 DIAGNOSIS — M5451 Vertebrogenic low back pain: Secondary | ICD-10-CM | POA: Diagnosis not present

## 2020-12-27 DIAGNOSIS — M545 Low back pain, unspecified: Secondary | ICD-10-CM | POA: Diagnosis not present

## 2020-12-27 DIAGNOSIS — M25562 Pain in left knee: Secondary | ICD-10-CM | POA: Diagnosis not present

## 2021-01-13 DIAGNOSIS — M25562 Pain in left knee: Secondary | ICD-10-CM | POA: Diagnosis not present

## 2021-01-13 DIAGNOSIS — M545 Low back pain, unspecified: Secondary | ICD-10-CM | POA: Diagnosis not present

## 2021-01-13 DIAGNOSIS — M25561 Pain in right knee: Secondary | ICD-10-CM | POA: Diagnosis not present

## 2021-01-13 DIAGNOSIS — M5451 Vertebrogenic low back pain: Secondary | ICD-10-CM | POA: Diagnosis not present

## 2021-03-09 ENCOUNTER — Ambulatory Visit: Payer: 59 | Admitting: Nurse Practitioner

## 2021-03-27 ENCOUNTER — Ambulatory Visit (INDEPENDENT_AMBULATORY_CARE_PROVIDER_SITE_OTHER): Payer: 59 | Admitting: Nurse Practitioner

## 2021-03-27 ENCOUNTER — Other Ambulatory Visit (HOSPITAL_BASED_OUTPATIENT_CLINIC_OR_DEPARTMENT_OTHER): Payer: Self-pay

## 2021-03-27 ENCOUNTER — Encounter: Payer: Self-pay | Admitting: Nurse Practitioner

## 2021-03-27 ENCOUNTER — Other Ambulatory Visit: Payer: Self-pay

## 2021-03-27 VITALS — BP 155/84 | HR 115 | Temp 98.1°F | Ht 66.0 in | Wt 377.2 lb

## 2021-03-27 DIAGNOSIS — Z7689 Persons encountering health services in other specified circumstances: Secondary | ICD-10-CM

## 2021-03-27 DIAGNOSIS — E559 Vitamin D deficiency, unspecified: Secondary | ICD-10-CM

## 2021-03-27 DIAGNOSIS — D352 Benign neoplasm of pituitary gland: Secondary | ICD-10-CM | POA: Diagnosis not present

## 2021-03-27 DIAGNOSIS — I10 Essential (primary) hypertension: Secondary | ICD-10-CM | POA: Diagnosis not present

## 2021-03-27 DIAGNOSIS — Z86018 Personal history of other benign neoplasm: Secondary | ICD-10-CM

## 2021-03-27 DIAGNOSIS — E049 Nontoxic goiter, unspecified: Secondary | ICD-10-CM

## 2021-03-27 DIAGNOSIS — Z1321 Encounter for screening for nutritional disorder: Secondary | ICD-10-CM | POA: Diagnosis not present

## 2021-03-27 DIAGNOSIS — Z13220 Encounter for screening for lipoid disorders: Secondary | ICD-10-CM | POA: Diagnosis not present

## 2021-03-27 DIAGNOSIS — Z0000 Encounter for general adult medical examination without abnormal findings: Secondary | ICD-10-CM | POA: Diagnosis not present

## 2021-03-27 LAB — POCT URINALYSIS DIP (CLINITEK)
Bilirubin, UA: NEGATIVE
Blood, UA: NEGATIVE
Glucose, UA: NEGATIVE mg/dL
Ketones, POC UA: NEGATIVE mg/dL
Leukocytes, UA: NEGATIVE
Nitrite, UA: NEGATIVE
POC PROTEIN,UA: NEGATIVE
Spec Grav, UA: 1.025 (ref 1.010–1.025)
Urobilinogen, UA: 0.2 E.U./dL
pH, UA: 6 (ref 5.0–8.0)

## 2021-03-27 LAB — POCT GLYCOSYLATED HEMOGLOBIN (HGB A1C)
HbA1c POC (<> result, manual entry): 5.3 % (ref 4.0–5.6)
HbA1c, POC (controlled diabetic range): 5.3 % (ref 0.0–7.0)
HbA1c, POC (prediabetic range): 5.3 % — AB (ref 5.7–6.4)
Hemoglobin A1C: 5.3 % (ref 4.0–5.6)

## 2021-03-27 LAB — GLUCOSE, POCT (MANUAL RESULT ENTRY): POC Glucose: 92 mg/dl (ref 70–99)

## 2021-03-27 MED ORDER — AMLODIPINE BESYLATE 2.5 MG PO TABS
2.5000 mg | ORAL_TABLET | Freq: Every day | ORAL | 1 refills | Status: DC
  Filled 2021-03-27 – 2021-04-05 (×2): qty 90, 90d supply, fill #0

## 2021-03-27 NOTE — Progress Notes (Signed)
[facility] [place], [place]  [address] Phone:  [phone]   Fax:  [phone]   New Patient Office Visit  Subjective:  Patient ID: Alice Silva, female    DOB: 26-Sep-1970  Age: 51 y.o. MRN: [id number]  CC:  Chief Complaint  Patient presents with   Establish Care    Pt is here to establish care. Pt states that she want to see if she can get blood work done today especially her Vitamin D level.    HPI Alice Silva presents to establish care. She  has a past medical history of Arthritis, GERD with esophagitis, History of gallstones, and Hypertension.   She reports at history of HTN. She was prescribed HCTZ 25 mg. She reports since being on this over the last 2 months she has had a change. She reports that her BP at home 120-130/70=80 . She feels like it is due to white coat syndrome. She disagrees with the need for her BP to be treated based on this. She agreed to trying the HCTZ. She feels like her CBG has been elevated since the start. She reports there is no family history of DM.  She has lap band; 03/2010. She has lost 65 pounds. However in March 2021; she has gained the weight back. This is due to a surgical malfunction. She will need a revision in order to get this working again. She has been prescribed the phentermine but  she has not started this. She does not want to depend on this for eight loss. She eats healthy for the most part. She has periods of unhealthy choices. She is not eating a lot of [place]. She is on an eating plan. She has gallstones so she has to  be careful. She is considering the keto diet; chicken and [place]. She is eating friuits and vegetables. She does not exercise. She has a sitting job and has noticed dependent edema.She has been mindful of this and moves more. The edema has improved.    Past Medical History:  Diagnosis Date   Arthritis    GERD with esophagitis    History of gallstones    Hypertension     Past Surgical  History:  Procedure Laterality Date   CERVICAL BIOPSY  W/ LOOP ELECTRODE EXCISION     LAPAROSCOPIC GASTRIC BANDING     TONSILLECTOMY AND ADENOIDECTOMY     TUBAL LIGATION      Family History  Problem Relation Age of Onset   Hyperlipidemia Mother    Depression Mother    Hyperlipidemia Father    Alcohol abuse Sister        cirrhosis   Dementia Maternal Uncle 33   Cancer Paternal Aunt 52       Breast   Cancer Maternal Grandmother 63       pancreatic   Stroke Paternal Grandmother    Heart disease Paternal Grandmother 43       MI    Social History   Socioeconomic History   Marital status: Single    Spouse name: Not on file   Number of children: 1   Years of education: Not on file   Highest education level: Not on file  Occupational History    Comment: RN- ICU  Tobacco Use   Smoking status: Never   Smokeless tobacco: Never  Vaping Use   Vaping Use: Never used  Substance and Sexual Activity   Alcohol use: Not on file  Comment: social   Drug use: Never   Sexual activity: Yes    Birth control/protection: Surgical    Comment: tubal ligation  Other Topics Concern   Not on file  Social History Narrative   Single, Boyfriend   daughter 60, garan son 7   Exercise: none   Social Determinants of Radio broadcast assistant Strain: Not on file  Food Insecurity: Not on file  Transportation Needs: Not on file  Physical Activity: Not on file  Stress: Not on file  Social Connections: Not on file  Intimate Partner Violence: Not on file    ROS Review of Systems  Objective:   Today's Vitals: BP (!) 155/84    Pulse (!) 115    Temp 98.1 F (36.7 C)    Ht 5\' 6"  (1.676 m)    Wt (!) 377 lb 3.2 oz (171.1 kg)    LMP 03/17/2021 (Exact Date)    SpO2 98%    BMI 60.88 kg/m   Physical Exam Constitutional:      General: She is not in acute distress.    Appearance: She is obese. She is not ill-appearing, toxic-appearing or diaphoretic.  HENT:     Head: Normocephalic and  atraumatic.     Right Ear: Tympanic membrane normal.     Ears:     Comments: Small amount dry cerumen     Nose: Nose normal.     Mouth/Throat:     Mouth: Mucous membranes are moist.  Neck:     Vascular: No carotid bruit.  Cardiovascular:     Rate and Rhythm: Normal rate.     Pulses: Normal pulses.     Heart sounds: Normal heart sounds.  Pulmonary:     Effort: Pulmonary effort is normal.     Breath sounds: Normal breath sounds.  Abdominal:     Palpations: Abdomen is soft.     Comments: Increased abdominal girth Hypoactive    Musculoskeletal:        General: Normal range of motion.     Cervical back: Normal range of motion. No tenderness.     Right lower leg: No edema.     Left lower leg: No edema.  Skin:    General: Skin is warm and dry.     Capillary Refill: Capillary refill takes less than 2 seconds.  Neurological:     General: No focal deficit present.     Mental Status: She is alert and oriented to person, place, and time.  Psychiatric:        Mood and Affect: Mood normal.        Behavior: Behavior normal.        Thought Content: Thought content normal.        Judgment: Judgment normal.    Assessment & Plan:   Problem List Items Addressed This Visit       Cardiovascular and Mediastinum   Essential hypertension Persistent ; maybe whitecoat Trial Amlodipine 2.5 mg daily  Encouraged on going compliance with current medication regimen Encouraged home monitoring and recording BP <130/80 Eating a heart-healthy diet with less salt Encouraged regular physical activity  Recommend Weight loss     Relevant Medications   amLODipine (NORVASC) 2.5 MG tablet   Other Relevant Orders   Comp. Metabolic Panel (12)     Endocrine   Pituitary microadenoma (HCC)     Other   Morbid obesity ([place]) Persistent  Obesity with BMI and comorbidities as noted above.  Discussed proper diet (low  fat, low sodium, high fiber) with patient.   Discussed need for regular exercise (3  times per week, 20 minutes per session) with patient.    Vitamin D deficiency Stable  reevaluation   Relevant Orders   VITAMIN D 25 Hydroxy (Vit-D Deficiency, Fractures)   History of prolactinoma   Other Visit Diagnoses     Encounter to establish care    -  Primary Discussed female health maintenance; SBE, annual CBE, PAP test Discussed general safety in vehicle and COVID Discussed regular hydration with water Discussed healthy diet and exercise and weight management Discussed sexual health  Discussed mental health Encouraged to call our office for an appointment with in ongoing concerns for questions.     Healthcare maintenance       Relevant Orders   HgB A1c (Completed)   Glucose (CBG) (Completed)   POCT URINALYSIS DIP (CLINITEK) (Completed)   CBC with Differential/Platelet   Encounter for vitamin deficiency screening       Relevant Orders   Vitamin B12   Screening for cholesterol level       Relevant Orders   Lipid panel   Enlarged thyroid     Persistent  Further evaluation to rule nodules    Relevant Orders   US THYROID       Outpatient Encounter Medications as of 03/27/2021  Medication Sig   amLODipine (NORVASC) 2.5 MG tablet Take 1 tablet (2.5 mg total) by mouth daily.   cyclobenzaprine (FLEXERIL) 10 MG tablet Take 1 tablet (10 mg total) by mouth at bedtime.   Garlic 1191 MG CAPS    Multiple Vitamin (MULTIVITAMIN ADULT PO)    Nutritional Supplements (VITAMIN D MAINTENANCE PO)    Probiotic Product (PROBIOTIC PEARLS) CAPS    [DISCONTINUED] hydrochlorothiazide (HYDRODIURIL) 25 MG tablet Take 1 tablet (25 mg total) by mouth daily.   [DISCONTINUED] phentermine (ADIPEX-P) 37.5 MG tablet Take 1 tablet (37.5 mg total) by mouth every morning before breakfast for 30 days Take daily before going to work   No facility-administered encounter medications on file as of 03/27/2021.    Follow-up: Return in about 6 weeks (around 05/08/2021) for Follow up HTN [id number].   Vevelyn Francois, NP

## 2021-03-28 LAB — COMP. METABOLIC PANEL (12)
AST: 13 IU/L (ref 0–40)
Albumin/Globulin Ratio: 1.4 (ref 1.2–2.2)
Albumin: 4.3 g/dL (ref 3.8–4.8)
Alkaline Phosphatase: 85 IU/L (ref 44–121)
BUN/Creatinine Ratio: 10 (ref 9–23)
BUN: 10 mg/dL (ref 6–24)
Bilirubin Total: 0.3 mg/dL (ref 0.0–1.2)
Calcium: 10 mg/dL (ref 8.7–10.2)
Chloride: 101 mmol/L (ref 96–106)
Creatinine, Ser: 0.99 mg/dL (ref 0.57–1.00)
Globulin, Total: 3 g/dL (ref 1.5–4.5)
Glucose: 96 mg/dL (ref 70–99)
Potassium: 4.3 mmol/L (ref 3.5–5.2)
Sodium: 138 mmol/L (ref 134–144)
Total Protein: 7.3 g/dL (ref 6.0–8.5)
eGFR: 70 mL/min/{1.73_m2} (ref >59)

## 2021-03-28 LAB — CBC WITH DIFFERENTIAL/PLATELET
Basophils Absolute: 0 10*3/uL (ref 0.0–0.2)
Basos: 1 %
EOS (ABSOLUTE): 0.2 10*3/uL (ref 0.0–0.4)
Eos: 2 %
Hematocrit: 42.3 % (ref 34.0–46.6)
Hemoglobin: 13.7 g/dL (ref 11.1–15.9)
Immature Grans (Abs): 0 10*3/uL (ref 0.0–0.1)
Immature Granulocytes: 0 %
Lymphocytes Absolute: 1.9 10*3/uL (ref 0.7–3.1)
Lymphs: 26 %
MCH: 27.8 pg (ref 26.6–33.0)
MCHC: 32.4 g/dL (ref 31.5–35.7)
MCV: 86 fL (ref 79–97)
Monocytes Absolute: 0.5 10*3/uL (ref 0.1–0.9)
Monocytes: 7 %
Neutrophils Absolute: 4.7 10*3/uL (ref 1.4–7.0)
Neutrophils: 64 %
Platelets: 386 10*3/uL (ref 150–450)
RBC: 4.93 x10E6/uL (ref 3.77–5.28)
RDW: 14.1 % (ref 11.7–15.4)
WBC: 7.3 10*3/uL (ref 3.4–10.8)

## 2021-03-28 LAB — LIPID PANEL
Chol/HDL Ratio: 3.6 ratio (ref 0.0–4.4)
Cholesterol, Total: 215 mg/dL — ABNORMAL HIGH (ref 100–199)
HDL: 59 mg/dL (ref >39)
LDL Chol Calc (NIH): 140 mg/dL — ABNORMAL HIGH (ref 0–99)
Triglycerides: 92 mg/dL (ref 0–149)
VLDL Cholesterol Cal: 16 mg/dL (ref 5–40)

## 2021-03-28 LAB — VITAMIN B12: Vitamin B-12: 491 pg/mL (ref 232–1245)

## 2021-03-28 LAB — VITAMIN D 25 HYDROXY (VIT D DEFICIENCY, FRACTURES): Vit D, 25-Hydroxy: 32.1 ng/mL (ref 30.0–100.0)

## 2021-04-03 ENCOUNTER — Other Ambulatory Visit (HOSPITAL_BASED_OUTPATIENT_CLINIC_OR_DEPARTMENT_OTHER): Payer: Self-pay

## 2021-04-05 ENCOUNTER — Other Ambulatory Visit (HOSPITAL_BASED_OUTPATIENT_CLINIC_OR_DEPARTMENT_OTHER): Payer: Self-pay

## 2021-04-11 ENCOUNTER — Ambulatory Visit (INDEPENDENT_AMBULATORY_CARE_PROVIDER_SITE_OTHER): Payer: 59

## 2021-04-11 ENCOUNTER — Ambulatory Visit (HOSPITAL_COMMUNITY): Admit: 2021-04-11 | Payer: 59 | Admitting: Gastroenterology

## 2021-04-11 ENCOUNTER — Other Ambulatory Visit: Payer: Self-pay

## 2021-04-11 ENCOUNTER — Encounter (HOSPITAL_COMMUNITY): Payer: Self-pay

## 2021-04-11 ENCOUNTER — Ambulatory Visit: Payer: 59 | Admitting: Podiatry

## 2021-04-11 DIAGNOSIS — M2141 Flat foot [pes planus] (acquired), right foot: Secondary | ICD-10-CM | POA: Diagnosis not present

## 2021-04-11 DIAGNOSIS — M2142 Flat foot [pes planus] (acquired), left foot: Secondary | ICD-10-CM

## 2021-04-11 DIAGNOSIS — S9031XA Contusion of right foot, initial encounter: Secondary | ICD-10-CM | POA: Diagnosis not present

## 2021-04-11 DIAGNOSIS — M25371 Other instability, right ankle: Secondary | ICD-10-CM | POA: Diagnosis not present

## 2021-04-11 SURGERY — COLONOSCOPY WITH PROPOFOL
Anesthesia: Monitor Anesthesia Care

## 2021-04-11 NOTE — Patient Instructions (Signed)
Call [phone] to schedule physical therapy  

## 2021-04-12 NOTE — Progress Notes (Signed)
°  Subjective:  Patient ID: Alice Silva, female    DOB: 06-08-1970,  MRN: [id number]  Chief Complaint  Patient presents with   Ankle Pain    Right ankle and foot- pt reports its been going on for over 20 years- mentioned she is now having constant sharp pains throughout the day.     51 y.o. female presents with the above complaint. History confirmed with patient.  Gets pain in the outside of the right foot and ankle.  Happens randomly and certain times a day.  Ankle feels unstable when she gets up and feels like it is rolling in and gives out.  Objective:  Physical Exam: warm, good capillary refill, no trophic changes or ulcerative lesions, normal DP and PT pulses, and normal sensory exam. Left Foot: normal exam, no swelling, tenderness, instability; ligaments intact, full range of motion of all ankle/foot joints Right Foot:  Some tenderness in the sinus tarsi and along the ATFL and CFL, no gross instability   Radiographs: Multiple views x-ray of right foot and ankle: Moderate degenerative changes throughout the joints of the hindfoot Assessment:   1. Ankle instability, right   2. Pes planus of both feet   3. Contusion of right foot, initial encounter      Plan:  Patient was evaluated and treated and all questions answered.  Discussed with her I suspect she likely has chronic functional ankle instability secondary to prior injuries.  She has significant evidence of arthritic changes in the hindfoot and ankle on her radiographs.  Currently the ankle is not constantly painful.  We discussed treatment for this including physical therapy.  Also discussed that surgical stabilization is an option but this would be a last resort.  Referral sent to [facility] physical therapy at [facility].  Return in about 2 months (around 06/09/2021) for recheck right ankle instability .

## 2021-04-17 ENCOUNTER — Other Ambulatory Visit: Payer: Self-pay | Admitting: Nurse Practitioner

## 2021-04-17 DIAGNOSIS — E049 Nontoxic goiter, unspecified: Secondary | ICD-10-CM

## 2021-04-19 ENCOUNTER — Ambulatory Visit (HOSPITAL_COMMUNITY): Payer: 59

## 2021-05-08 ENCOUNTER — Ambulatory Visit: Payer: 59 | Admitting: Nurse Practitioner

## 2021-05-15 ENCOUNTER — Other Ambulatory Visit (HOSPITAL_BASED_OUTPATIENT_CLINIC_OR_DEPARTMENT_OTHER): Payer: Self-pay

## 2021-05-15 ENCOUNTER — Encounter (HOSPITAL_BASED_OUTPATIENT_CLINIC_OR_DEPARTMENT_OTHER): Payer: Self-pay

## 2021-05-15 ENCOUNTER — Other Ambulatory Visit: Payer: Self-pay | Admitting: Nurse Practitioner

## 2021-05-15 ENCOUNTER — Ambulatory Visit (HOSPITAL_BASED_OUTPATIENT_CLINIC_OR_DEPARTMENT_OTHER): Admission: RE | Admit: 2021-05-15 | Payer: 59 | Source: Ambulatory Visit

## 2021-05-15 ENCOUNTER — Ambulatory Visit (HOSPITAL_COMMUNITY): Payer: 59

## 2021-05-15 MED ORDER — AMLODIPINE BESYLATE 5 MG PO TABS
ORAL_TABLET | ORAL | 0 refills | Status: DC
  Filled 2021-05-15 – 2021-05-25 (×2): qty 177, 90d supply, fill #0

## 2021-05-22 ENCOUNTER — Other Ambulatory Visit (HOSPITAL_BASED_OUTPATIENT_CLINIC_OR_DEPARTMENT_OTHER): Payer: Self-pay

## 2021-05-25 ENCOUNTER — Other Ambulatory Visit (HOSPITAL_BASED_OUTPATIENT_CLINIC_OR_DEPARTMENT_OTHER): Payer: Self-pay

## 2021-06-13 ENCOUNTER — Other Ambulatory Visit (HOSPITAL_BASED_OUTPATIENT_CLINIC_OR_DEPARTMENT_OTHER): Payer: Self-pay

## 2021-06-13 ENCOUNTER — Ambulatory Visit: Payer: 59 | Admitting: Podiatry

## 2021-09-04 ENCOUNTER — Other Ambulatory Visit (HOSPITAL_BASED_OUTPATIENT_CLINIC_OR_DEPARTMENT_OTHER): Payer: Self-pay

## 2021-09-04 DIAGNOSIS — Z86018 Personal history of other benign neoplasm: Secondary | ICD-10-CM | POA: Diagnosis not present

## 2021-09-04 DIAGNOSIS — I10 Essential (primary) hypertension: Secondary | ICD-10-CM | POA: Diagnosis not present

## 2021-09-04 DIAGNOSIS — E559 Vitamin D deficiency, unspecified: Secondary | ICD-10-CM | POA: Diagnosis not present

## 2021-09-04 DIAGNOSIS — E7800 Pure hypercholesterolemia, unspecified: Secondary | ICD-10-CM | POA: Diagnosis not present

## 2021-09-04 DIAGNOSIS — Z6841 Body Mass Index (BMI) 40.0 and over, adult: Secondary | ICD-10-CM | POA: Diagnosis not present

## 2021-09-04 MED ORDER — METOPROLOL SUCCINATE ER 25 MG PO TB24
25.0000 mg | ORAL_TABLET | Freq: Every day | ORAL | 1 refills | Status: DC
  Filled 2021-09-04: qty 90, 90d supply, fill #0

## 2021-09-22 ENCOUNTER — Other Ambulatory Visit (HOSPITAL_BASED_OUTPATIENT_CLINIC_OR_DEPARTMENT_OTHER): Payer: Self-pay

## 2021-09-22 MED ORDER — METOPROLOL SUCCINATE ER 50 MG PO TB24: 50.0000 mg | ORAL_TABLET | Freq: Every day | ORAL | 1 refills | Status: DC

## 2021-10-24 ENCOUNTER — Other Ambulatory Visit (HOSPITAL_BASED_OUTPATIENT_CLINIC_OR_DEPARTMENT_OTHER): Payer: Self-pay

## 2021-10-24 MED ORDER — CYCLOBENZAPRINE HCL 10 MG PO TABS
ORAL_TABLET | ORAL | 0 refills | Status: DC
  Filled 2021-10-24: qty 30, 30d supply, fill #0

## 2021-10-24 MED ORDER — METOPROLOL SUCCINATE ER 50 MG PO TB24
50.0000 mg | ORAL_TABLET | Freq: Every day | ORAL | 1 refills | Status: DC
  Filled 2021-10-31: qty 90, 90d supply, fill #0
  Filled ????-??-?? (×2): fill #1

## 2021-10-31 ENCOUNTER — Other Ambulatory Visit (HOSPITAL_BASED_OUTPATIENT_CLINIC_OR_DEPARTMENT_OTHER): Payer: Self-pay

## 2021-12-01 ENCOUNTER — Other Ambulatory Visit (HOSPITAL_BASED_OUTPATIENT_CLINIC_OR_DEPARTMENT_OTHER): Payer: Self-pay

## 2021-12-01 DIAGNOSIS — R000 Tachycardia, unspecified: Secondary | ICD-10-CM | POA: Diagnosis not present

## 2021-12-01 DIAGNOSIS — Z6841 Body Mass Index (BMI) 40.0 and over, adult: Secondary | ICD-10-CM | POA: Diagnosis not present

## 2021-12-01 DIAGNOSIS — Z23 Encounter for immunization: Secondary | ICD-10-CM | POA: Diagnosis not present

## 2021-12-01 DIAGNOSIS — I10 Essential (primary) hypertension: Secondary | ICD-10-CM | POA: Diagnosis not present

## 2021-12-01 MED ORDER — METOPROLOL SUCCINATE ER 50 MG PO TB24
75.0000 mg | ORAL_TABLET | Freq: Every day | ORAL | 1 refills | Status: DC
  Filled 2021-12-01: qty 135, 90d supply, fill #0

## 2021-12-05 ENCOUNTER — Other Ambulatory Visit (HOSPITAL_BASED_OUTPATIENT_CLINIC_OR_DEPARTMENT_OTHER): Payer: Self-pay

## 2021-12-05 MED ORDER — BUPROPION HCL ER (XL) 150 MG PO TB24
150.0000 mg | ORAL_TABLET | Freq: Every day | ORAL | 2 refills | Status: DC
  Filled 2021-12-05: qty 30, 30d supply, fill #0

## 2021-12-25 ENCOUNTER — Other Ambulatory Visit (HOSPITAL_BASED_OUTPATIENT_CLINIC_OR_DEPARTMENT_OTHER): Payer: Self-pay

## 2021-12-25 MED ORDER — WEGOVY 0.5 MG/0.5ML ~~LOC~~ SOAJ: 0.5000 mg | SUBCUTANEOUS | 0 refills | Status: DC

## 2021-12-25 MED ORDER — WEGOVY 0.25 MG/0.5ML ~~LOC~~ SOAJ
SUBCUTANEOUS | 0 refills | Status: DC
Start: 2021-12-25 — End: 2023-09-10
  Filled 2021-12-25 – 2022-03-29 (×3): qty 2, 28d supply, fill #0

## 2021-12-26 ENCOUNTER — Other Ambulatory Visit (HOSPITAL_BASED_OUTPATIENT_CLINIC_OR_DEPARTMENT_OTHER): Payer: Self-pay

## 2021-12-27 ENCOUNTER — Other Ambulatory Visit (HOSPITAL_BASED_OUTPATIENT_CLINIC_OR_DEPARTMENT_OTHER): Payer: Self-pay

## 2021-12-28 ENCOUNTER — Other Ambulatory Visit (HOSPITAL_BASED_OUTPATIENT_CLINIC_OR_DEPARTMENT_OTHER): Payer: Self-pay

## 2022-01-02 ENCOUNTER — Other Ambulatory Visit (HOSPITAL_BASED_OUTPATIENT_CLINIC_OR_DEPARTMENT_OTHER): Payer: Self-pay

## 2022-01-02 MED ORDER — METOPROLOL SUCCINATE ER 100 MG PO TB24
100.0000 mg | ORAL_TABLET | Freq: Every day | ORAL | 1 refills | Status: DC
  Filled 2022-01-02 – 2022-03-29 (×2): qty 90, 90d supply, fill #0

## 2022-01-04 ENCOUNTER — Other Ambulatory Visit (HOSPITAL_BASED_OUTPATIENT_CLINIC_OR_DEPARTMENT_OTHER): Payer: Self-pay

## 2022-01-15 ENCOUNTER — Other Ambulatory Visit (HOSPITAL_BASED_OUTPATIENT_CLINIC_OR_DEPARTMENT_OTHER): Payer: Self-pay

## 2022-01-26 ENCOUNTER — Other Ambulatory Visit (HOSPITAL_BASED_OUTPATIENT_CLINIC_OR_DEPARTMENT_OTHER): Payer: Self-pay

## 2022-02-08 ENCOUNTER — Other Ambulatory Visit (HOSPITAL_BASED_OUTPATIENT_CLINIC_OR_DEPARTMENT_OTHER): Payer: Self-pay

## 2022-02-16 ENCOUNTER — Other Ambulatory Visit (HOSPITAL_BASED_OUTPATIENT_CLINIC_OR_DEPARTMENT_OTHER): Payer: Self-pay

## 2022-02-22 ENCOUNTER — Other Ambulatory Visit (HOSPITAL_BASED_OUTPATIENT_CLINIC_OR_DEPARTMENT_OTHER): Payer: Self-pay

## 2022-02-23 ENCOUNTER — Other Ambulatory Visit (HOSPITAL_BASED_OUTPATIENT_CLINIC_OR_DEPARTMENT_OTHER): Payer: Self-pay

## 2022-03-03 ENCOUNTER — Other Ambulatory Visit (HOSPITAL_BASED_OUTPATIENT_CLINIC_OR_DEPARTMENT_OTHER): Payer: Self-pay

## 2022-03-07 ENCOUNTER — Other Ambulatory Visit (HOSPITAL_BASED_OUTPATIENT_CLINIC_OR_DEPARTMENT_OTHER): Payer: Self-pay

## 2022-03-13 ENCOUNTER — Other Ambulatory Visit (HOSPITAL_BASED_OUTPATIENT_CLINIC_OR_DEPARTMENT_OTHER): Payer: Self-pay

## 2022-03-13 DIAGNOSIS — Z01419 Encounter for gynecological examination (general) (routine) without abnormal findings: Secondary | ICD-10-CM | POA: Diagnosis not present

## 2022-03-13 DIAGNOSIS — Z113 Encounter for screening for infections with a predominantly sexual mode of transmission: Secondary | ICD-10-CM | POA: Diagnosis not present

## 2022-03-13 DIAGNOSIS — Z01411 Encounter for gynecological examination (general) (routine) with abnormal findings: Secondary | ICD-10-CM | POA: Diagnosis not present

## 2022-03-13 DIAGNOSIS — Z1231 Encounter for screening mammogram for malignant neoplasm of breast: Secondary | ICD-10-CM | POA: Diagnosis not present

## 2022-03-13 DIAGNOSIS — Z1239 Encounter for other screening for malignant neoplasm of breast: Secondary | ICD-10-CM | POA: Diagnosis not present

## 2022-03-13 DIAGNOSIS — D069 Carcinoma in situ of cervix, unspecified: Secondary | ICD-10-CM | POA: Diagnosis not present

## 2022-03-13 DIAGNOSIS — Z6841 Body Mass Index (BMI) 40.0 and over, adult: Secondary | ICD-10-CM | POA: Diagnosis not present

## 2022-03-13 DIAGNOSIS — Z1211 Encounter for screening for malignant neoplasm of colon: Secondary | ICD-10-CM | POA: Diagnosis not present

## 2022-03-13 DIAGNOSIS — R350 Frequency of micturition: Secondary | ICD-10-CM | POA: Diagnosis not present

## 2022-03-13 DIAGNOSIS — Z9851 Tubal ligation status: Secondary | ICD-10-CM | POA: Diagnosis not present

## 2022-03-13 DIAGNOSIS — N920 Excessive and frequent menstruation with regular cycle: Secondary | ICD-10-CM | POA: Diagnosis not present

## 2022-03-15 ENCOUNTER — Other Ambulatory Visit (HOSPITAL_BASED_OUTPATIENT_CLINIC_OR_DEPARTMENT_OTHER): Payer: Self-pay

## 2022-03-22 ENCOUNTER — Other Ambulatory Visit (HOSPITAL_BASED_OUTPATIENT_CLINIC_OR_DEPARTMENT_OTHER): Payer: Self-pay

## 2022-03-29 ENCOUNTER — Other Ambulatory Visit: Payer: Self-pay

## 2022-03-29 ENCOUNTER — Other Ambulatory Visit (HOSPITAL_COMMUNITY): Payer: Self-pay

## 2022-04-02 ENCOUNTER — Other Ambulatory Visit: Payer: Self-pay

## 2022-04-02 DIAGNOSIS — R87619 Unspecified abnormal cytological findings in specimens from cervix uteri: Secondary | ICD-10-CM | POA: Diagnosis not present

## 2022-04-19 ENCOUNTER — Other Ambulatory Visit (HOSPITAL_COMMUNITY): Payer: Self-pay

## 2022-04-30 ENCOUNTER — Other Ambulatory Visit: Payer: Self-pay | Admitting: Obstetrics and Gynecology

## 2022-04-30 DIAGNOSIS — D069 Carcinoma in situ of cervix, unspecified: Secondary | ICD-10-CM

## 2022-05-08 ENCOUNTER — Other Ambulatory Visit: Payer: Self-pay

## 2022-05-08 ENCOUNTER — Other Ambulatory Visit (HOSPITAL_COMMUNITY): Payer: Self-pay

## 2022-05-08 MED ORDER — BUPROPION HCL ER (XL) 150 MG PO TB24
150.0000 mg | ORAL_TABLET | Freq: Every day | ORAL | 0 refills | Status: DC
  Filled 2022-05-08: qty 90, 90d supply, fill #0

## 2022-05-08 MED ORDER — NALTREXONE HCL 50 MG PO TABS
25.0000 mg | ORAL_TABLET | Freq: Two times a day (BID) | ORAL | 0 refills | Status: DC
  Filled 2022-05-08: qty 90, 90d supply, fill #0

## 2022-06-26 ENCOUNTER — Other Ambulatory Visit: Payer: Self-pay

## 2022-06-26 ENCOUNTER — Other Ambulatory Visit (HOSPITAL_COMMUNITY): Payer: Self-pay

## 2022-06-26 MED ORDER — METOPROLOL SUCCINATE ER 100 MG PO TB24
100.0000 mg | ORAL_TABLET | Freq: Every day | ORAL | 0 refills | Status: DC
  Filled 2022-06-26: qty 90, 90d supply, fill #0

## 2022-09-18 ENCOUNTER — Other Ambulatory Visit (HOSPITAL_COMMUNITY): Payer: Self-pay

## 2022-09-18 DIAGNOSIS — R000 Tachycardia, unspecified: Secondary | ICD-10-CM | POA: Diagnosis not present

## 2022-09-18 DIAGNOSIS — I10 Essential (primary) hypertension: Secondary | ICD-10-CM | POA: Diagnosis not present

## 2022-09-18 DIAGNOSIS — E782 Mixed hyperlipidemia: Secondary | ICD-10-CM | POA: Diagnosis not present

## 2022-09-18 DIAGNOSIS — E669 Obesity, unspecified: Secondary | ICD-10-CM | POA: Diagnosis not present

## 2022-09-18 MED ORDER — CYCLOBENZAPRINE HCL 10 MG PO TABS
10.0000 mg | ORAL_TABLET | Freq: Three times a day (TID) | ORAL | 1 refills | Status: DC | PRN
  Filled 2022-09-18: qty 30, 10d supply, fill #0
  Filled 2023-07-10: qty 30, 10d supply, fill #1

## 2022-09-18 MED ORDER — METOPROLOL SUCCINATE ER 100 MG PO TB24
100.0000 mg | ORAL_TABLET | Freq: Every day | ORAL | 5 refills | Status: DC
  Filled 2022-09-18: qty 30, 30d supply, fill #0
  Filled 2022-11-13: qty 90, 90d supply, fill #1
  Filled 2023-02-09: qty 60, 60d supply, fill #2

## 2022-09-19 ENCOUNTER — Other Ambulatory Visit: Payer: Self-pay

## 2022-09-19 ENCOUNTER — Other Ambulatory Visit (HOSPITAL_COMMUNITY): Payer: Self-pay

## 2022-09-19 MED ORDER — CONTRAVE 8-90 MG PO TB12
2.0000 | ORAL_TABLET | Freq: Two times a day (BID) | ORAL | 5 refills | Status: DC
  Filled 2022-09-19 – 2022-09-29 (×2): qty 60, 15d supply, fill #0

## 2022-09-20 ENCOUNTER — Other Ambulatory Visit (HOSPITAL_COMMUNITY): Payer: Self-pay

## 2022-09-20 MED ORDER — CONTRAVE 8-90 MG PO TB12
2.0000 | ORAL_TABLET | Freq: Two times a day (BID) | ORAL | 0 refills | Status: DC
  Filled 2022-10-18: qty 120, 30d supply, fill #0

## 2022-09-20 MED ORDER — CONTRAVE 8-90 MG PO TB12
2.0000 | ORAL_TABLET | Freq: Two times a day (BID) | ORAL | 5 refills | Status: DC
  Filled 2022-09-20: qty 120, 30d supply, fill #0

## 2022-09-21 ENCOUNTER — Other Ambulatory Visit (HOSPITAL_COMMUNITY): Payer: Self-pay

## 2022-09-29 ENCOUNTER — Other Ambulatory Visit (HOSPITAL_COMMUNITY): Payer: Self-pay

## 2022-10-01 ENCOUNTER — Other Ambulatory Visit (HOSPITAL_COMMUNITY): Payer: Self-pay

## 2022-10-19 ENCOUNTER — Other Ambulatory Visit (HOSPITAL_COMMUNITY): Payer: Self-pay

## 2022-11-13 ENCOUNTER — Other Ambulatory Visit (HOSPITAL_COMMUNITY): Payer: Self-pay

## 2022-12-01 DIAGNOSIS — Z23 Encounter for immunization: Secondary | ICD-10-CM | POA: Diagnosis not present

## 2023-02-09 ENCOUNTER — Other Ambulatory Visit (HOSPITAL_COMMUNITY): Payer: Self-pay

## 2023-04-08 ENCOUNTER — Other Ambulatory Visit (HOSPITAL_COMMUNITY): Payer: Self-pay

## 2023-04-08 MED ORDER — CYCLOBENZAPRINE HCL 10 MG PO TABS
10.0000 mg | ORAL_TABLET | Freq: Three times a day (TID) | ORAL | 0 refills | Status: DC | PRN
  Filled 2023-04-08: qty 30, 10d supply, fill #0

## 2023-04-08 MED ORDER — METOPROLOL SUCCINATE ER 100 MG PO TB24
100.0000 mg | ORAL_TABLET | Freq: Every day | ORAL | 1 refills | Status: DC
  Filled 2023-04-08: qty 90, 90d supply, fill #0
  Filled 2023-07-10: qty 90, 90d supply, fill #1

## 2023-04-21 DIAGNOSIS — Z1211 Encounter for screening for malignant neoplasm of colon: Secondary | ICD-10-CM | POA: Diagnosis not present

## 2023-04-23 ENCOUNTER — Other Ambulatory Visit (HOSPITAL_COMMUNITY): Payer: Self-pay

## 2023-04-23 DIAGNOSIS — L720 Epidermal cyst: Secondary | ICD-10-CM | POA: Diagnosis not present

## 2023-04-23 MED ORDER — CLOBETASOL PROPIONATE 0.05 % EX OINT
1.0000 | TOPICAL_OINTMENT | Freq: Two times a day (BID) | CUTANEOUS | 2 refills | Status: AC
  Filled 2023-04-23: qty 60, 30d supply, fill #0
  Filled 2023-07-10: qty 60, 30d supply, fill #1

## 2023-04-26 LAB — COLOGUARD: COLOGUARD: NEGATIVE

## 2023-04-26 LAB — EXTERNAL GENERIC LAB PROCEDURE: COLOGUARD: NEGATIVE

## 2023-05-06 ENCOUNTER — Other Ambulatory Visit (INDEPENDENT_AMBULATORY_CARE_PROVIDER_SITE_OTHER): Payer: Self-pay

## 2023-05-06 ENCOUNTER — Telehealth: Payer: Self-pay

## 2023-05-06 ENCOUNTER — Encounter: Payer: Self-pay | Admitting: Orthopedic Surgery

## 2023-05-06 ENCOUNTER — Ambulatory Visit: Payer: Commercial Managed Care - PPO | Admitting: Orthopedic Surgery

## 2023-05-06 DIAGNOSIS — G8929 Other chronic pain: Secondary | ICD-10-CM | POA: Diagnosis not present

## 2023-05-06 DIAGNOSIS — M25561 Pain in right knee: Secondary | ICD-10-CM

## 2023-05-06 DIAGNOSIS — M170 Bilateral primary osteoarthritis of knee: Secondary | ICD-10-CM | POA: Diagnosis not present

## 2023-05-06 DIAGNOSIS — M25562 Pain in left knee: Secondary | ICD-10-CM

## 2023-05-06 NOTE — Telephone Encounter (Signed)
 Auth needed for bil knee gel

## 2023-05-06 NOTE — Progress Notes (Unsigned)
 Office Visit Note   Patient: Alice Silva           Date of Birth: May 29, 1970           MRN: [id number] Visit Date: 05/06/2023 Requested by: No referring provider defined for this encounter. PCP: Tilman Neat, NP  Subjective: Chief Complaint  Patient presents with   Other    Bilateral knee pain R=L    HPI: Alice Silva is a 53 y.o. female who presents to the office reporting bilateral knee pain right equal to left.  She had prior left knee injury 01/2003 which was a meniscal tear but did not have surgery.  She does report some weakness giving way as well as some mechanical symptoms in the knee.  She typically "feels with the pain".  She describes decreased standing endurance to about 2 to 5 minutes.  Walking endurance is about 100 yards.  She works in telemetry critical care.  She did have Euflexxa injections about 4 years ago which helped..                ROS: All systems reviewed are negative as they relate to the chief complaint within the history of present illness.  Patient denies fevers or chills.  Assessment & Plan: Visit Diagnoses:  1. Chronic pain of both knees     Plan: Impression is bilateral severe knee arthritis in a patient who has increased body mass index and is not currently a candidate for knee replacement.  Bilateral knee injections are performed today.  Cortisone injections.  We will preapproved for for single shot gel injection.  Follow-up when these cortisone injections wear off.  Follow-Up Instructions: No follow-ups on file.   Orders:  Orders Placed This Encounter  Procedures   XR KNEE 3 VIEW RIGHT   XR Knee 1-2 Views Left   No orders of the defined types were placed in this encounter.     Procedures: Large Joint Inj: bilateral knee on 05/06/2023 7:26 PM Indications: diagnostic evaluation, joint swelling and pain Details: 18 G 1.5 in and 3.5 in needle, superolateral approach  Arthrogram: No  Medications (Right): 5 mL lidocaine 1 %; 4 mL  bupivacaine 0.25 %; 40 mg methylPREDNISolone acetate 40 MG/ML Medications (Left): 5 mL lidocaine 1 %; 4 mL bupivacaine 0.25 %; 40 mg methylPREDNISolone acetate 40 MG/ML Outcome: tolerated well, no immediate complications Procedure, treatment alternatives, risks and benefits explained, specific risks discussed. Consent was given by the patient. Immediately prior to procedure a time out was called to verify the correct patient, procedure, equipment, support staff and site/side marked as required. Patient was prepped and draped in the usual sterile fashion.       Clinical Data: No additional findings.  Objective: Vital Signs: There were no vitals taken for this visit.  Physical Exam:  Constitutional: Patient appears well-developed HEENT:  Head: Normocephalic Eyes:EOM are normal Neck: Normal range of motion Cardiovascular: Normal rate Pulmonary/chest: Effort normal Neurologic: Patient is alert Skin: Skin is warm Psychiatric: Patient has normal mood and affect  Ortho Exam: Ortho exam demonstrates the 5 to 10 degree flexion contracture in both knees.  Flexion is to about 85 on the right and 70 on the left.  Both feet are perfused and sensate.  No groin pain with internal/external Tatian of the leg.  No other masses lymphadenopathy or skin changes noted in that right or left knee region.  Overall the examination is somewhat difficult due to knee structural issues. This patient is diagnosed with osteoarthritis  of the knee(s).    Radiographs show evidence of joint space narrowing, osteophytes, subchondral sclerosis and/or subchondral cysts.  This patient has knee pain which interferes with functional and activities of daily living.    This patient has experienced inadequate response, adverse effects and/or intolerance with conservative treatments such as acetaminophen, NSAIDS, topical creams, physical therapy or regular exercise, knee bracing and/or weight loss.   This patient has  experienced inadequate response or has a contraindication to intra articular steroid injections for at least 3 months.   This patient is not scheduled to have a total knee replacement within 6 months of starting treatment with viscosupplementation.   Specialty Comments:  No specialty comments available.  Imaging: XR KNEE 3 VIEW RIGHT Result Date: 05/06/2023 AP lateral merchant radiographs right knee reviewed.  Severe end-stage tricompartmental arthritis is present with varus alignment.  No acute fracture.  XR Knee 1-2 Views Left Result Date: 05/06/2023 AP lateral merchant radiographs left knee reviewed.  Severe end-stage tricompartmental arthritis is present with varus alignment and involving mild subluxation of the femur relative to the tibia.  Significant chronic compression of the medial tibial plateau is present.  No acute fracture.    PMFS History: Patient Active Problem List   Diagnosis Date Noted   Chronic back pain 06/27/2020   History of prolactinoma 06/27/2020   Pain in both knees 10/29/2018   Primary osteoarthritis of both knees 10/29/2018   LAP-BAND surgery status 03/12/2018   Pituitary microadenoma (HCC) 02/26/2018   Vitamin D deficiency 02/26/2018   Essential hypertension 03/24/2013   Morbid obesity (HCC) 03/24/2013   Uterine leiomyoma 03/24/2013   Past Medical History:  Diagnosis Date   Arthritis    GERD with esophagitis    History of gallstones    Hypertension     Family History  Problem Relation Age of Onset   Hyperlipidemia Mother    Depression Mother    Hyperlipidemia Father    Alcohol abuse Sister        cirrhosis   Dementia Maternal Uncle 65   Cancer Paternal Aunt 71       Breast   Cancer Maternal Grandmother 55       pancreatic   Stroke Paternal Grandmother    Heart disease Paternal Grandmother 44       MI    Past Surgical History:  Procedure Laterality Date   CERVICAL BIOPSY  W/ LOOP ELECTRODE EXCISION     LAPAROSCOPIC GASTRIC BANDING      TONSILLECTOMY AND ADENOIDECTOMY     TUBAL LIGATION     Social History   Occupational History    Comment: RN- ICU  Tobacco Use   Smoking status: Never   Smokeless tobacco: Never  Vaping Use   Vaping status: Never Used  Substance and Sexual Activity   Alcohol use: Not on file    Comment: social   Drug use: Never   Sexual activity: Yes    Birth control/protection: Surgical    Comment: tubal ligation

## 2023-05-08 ENCOUNTER — Encounter: Payer: Self-pay | Admitting: Orthopedic Surgery

## 2023-05-09 MED ORDER — LIDOCAINE HCL 1 % IJ SOLN
5.0000 mL | INTRAMUSCULAR | Status: AC | PRN
  Administered 2023-05-06: 5 mL

## 2023-05-09 MED ORDER — BUPIVACAINE HCL 0.25 % IJ SOLN
4.0000 mL | INTRAMUSCULAR | Status: AC | PRN
Start: 2023-05-06 — End: 2023-05-06
  Administered 2023-05-06: 4 mL via INTRA_ARTICULAR

## 2023-05-09 MED ORDER — METHYLPREDNISOLONE ACETATE 40 MG/ML IJ SUSP
40.0000 mg | INTRAMUSCULAR | Status: AC | PRN
  Administered 2023-05-06: 40 mg via INTRA_ARTICULAR

## 2023-05-09 MED ORDER — BUPIVACAINE HCL 0.25 % IJ SOLN
4.0000 mL | INTRAMUSCULAR | Status: AC | PRN
  Administered 2023-05-06: 4 mL via INTRA_ARTICULAR

## 2023-05-09 MED ORDER — LIDOCAINE HCL 1 % IJ SOLN
5.0000 mL | INTRAMUSCULAR | Status: AC | PRN
Start: 2023-05-06 — End: 2023-05-06
  Administered 2023-05-06: 5 mL

## 2023-05-17 ENCOUNTER — Other Ambulatory Visit (HOSPITAL_COMMUNITY): Payer: Self-pay

## 2023-05-17 ENCOUNTER — Encounter: Payer: Self-pay | Admitting: Obstetrics and Gynecology

## 2023-05-17 DIAGNOSIS — D352 Benign neoplasm of pituitary gland: Secondary | ICD-10-CM | POA: Diagnosis not present

## 2023-05-17 DIAGNOSIS — I10 Essential (primary) hypertension: Secondary | ICD-10-CM | POA: Diagnosis not present

## 2023-05-17 MED ORDER — LOSARTAN POTASSIUM 50 MG PO TABS
50.0000 mg | ORAL_TABLET | Freq: Every day | ORAL | 1 refills | Status: DC
  Filled 2023-05-17 (×2): qty 90, 90d supply, fill #0

## 2023-05-19 ENCOUNTER — Encounter (HOSPITAL_BASED_OUTPATIENT_CLINIC_OR_DEPARTMENT_OTHER): Payer: Self-pay | Admitting: Emergency Medicine

## 2023-05-19 ENCOUNTER — Emergency Department (HOSPITAL_BASED_OUTPATIENT_CLINIC_OR_DEPARTMENT_OTHER)
Admission: EM | Admit: 2023-05-19 | Discharge: 2023-05-19 | Disposition: A | Discharge status: Home / Self Care | Attending: Emergency Medicine | Admitting: Emergency Medicine

## 2023-05-19 DIAGNOSIS — I10 Essential (primary) hypertension: Secondary | ICD-10-CM | POA: Diagnosis present

## 2023-05-19 DIAGNOSIS — Z79899 Other long term (current) drug therapy: Secondary | ICD-10-CM | POA: Insufficient documentation

## 2023-05-19 DIAGNOSIS — I159 Secondary hypertension, unspecified: Secondary | ICD-10-CM | POA: Diagnosis not present

## 2023-05-19 LAB — CBC WITH DIFFERENTIAL/PLATELET
Abs Immature Granulocytes: 0.05 10*3/uL (ref 0.00–0.07)
Basophils Absolute: 0.1 10*3/uL (ref 0.0–0.1)
Basophils Relative: 1 %
Eosinophils Absolute: 0.1 10*3/uL (ref 0.0–0.5)
Eosinophils Relative: 1 %
HCT: 40.7 % (ref 36.0–46.0)
Hemoglobin: 13.3 g/dL (ref 12.0–15.0)
Immature Granulocytes: 1 %
Lymphocytes Relative: 24 %
Lymphs Abs: 2.3 10*3/uL (ref 0.7–4.0)
MCH: 29.2 pg (ref 26.0–34.0)
MCHC: 32.7 g/dL (ref 30.0–36.0)
MCV: 89.5 fL (ref 80.0–100.0)
Monocytes Absolute: 0.6 10*3/uL (ref 0.1–1.0)
Monocytes Relative: 6 %
Neutro Abs: 6.5 10*3/uL (ref 1.7–7.7)
Neutrophils Relative %: 67 %
Platelets: 393 10*3/uL (ref 150–400)
RBC: 4.55 MIL/uL (ref 3.87–5.11)
RDW: 15.4 % (ref 11.5–15.5)
WBC: 9.6 10*3/uL (ref 4.0–10.5)
nRBC: 0 % (ref 0.0–0.2)

## 2023-05-19 LAB — COMPREHENSIVE METABOLIC PANEL WITH GFR
ALT: 21 U/L (ref 0–44)
AST: 23 U/L (ref 15–41)
Albumin: 3.9 g/dL (ref 3.5–5.0)
Alkaline Phosphatase: 77 U/L (ref 38–126)
Anion gap: 14 (ref 5–15)
BUN: 15 mg/dL (ref 6–20)
CO2: 23 mmol/L (ref 22–32)
Calcium: 9.1 mg/dL (ref 8.9–10.3)
Chloride: 100 mmol/L (ref 98–111)
Creatinine, Ser: 1.13 mg/dL — ABNORMAL HIGH (ref 0.44–1.00)
GFR, Estimated: 59 mL/min — ABNORMAL LOW (ref >60)
Glucose, Bld: 124 mg/dL — ABNORMAL HIGH (ref 70–99)
Potassium: 4 mmol/L (ref 3.5–5.1)
Sodium: 137 mmol/L (ref 135–145)
Total Bilirubin: 0.4 mg/dL (ref 0.0–1.2)
Total Protein: 7.8 g/dL (ref 6.5–8.1)

## 2023-05-19 NOTE — ED Provider Notes (Signed)
 [place] [facility] Provider Note   CSN: [id number] Arrival date & time: 05/19/23  1211     History {Add pertinent medical, surgical, social history, OB history to HPI:1} Chief Complaint  Patient presents with   Hypertension    Alice Silva is a 53 y.o. female.  With a history of hypertension who presents to the ED for hypertension.  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  Elevated blood pressure for last 5 days at home.  Saw PCP 2 days ago and was started on losartan 50 mg in addition to her previously prescribed Toprol XL and amlodipine.  Outpatient laboratory workup from last week showed hyperkalemia 5.4 so she has not yet started the losartan.  Symptomatically she does report mild headaches no chest pain or other complaints at this time.  Denies shortness of breath nausea vomiting diarrhea fevers and chills.  Does note recent sinus congestion for which which she took Sudafed.  Over the last week   Hypertension       Home Medications Prior to Admission medications   Medication Sig Start Date End Date Taking? Authorizing Provider  amLODipine (NORVASC) 2.5 MG tablet Take 1 tablet (2.5 mg total) by mouth daily. 03/27/21 07/04/21  Barbette Merino, NP  amLODipine (NORVASC) 5 MG tablet Take 1 tablet (5 mg total) by mouth daily for 3 days, THEN 2 tablets (10 mg total) daily. 05/15/21 08/23/21  Barbette Merino, NP  buPROPion (WELLBUTRIN XL) 150 MG 24 hr tablet Take 1 tablet (150 mg total) by mouth daily. 12/05/21     buPROPion (WELLBUTRIN XL) 150 MG 24 hr tablet Take 1 tablet (150 mg total) by mouth daily for depression 05/08/22     clobetasol ointment (TEMOVATE) 0.05 % Apply 1 Application topically 2 (two) times daily. 04/23/23     CONTRAVE 8-90 MG TB12 Take 2 tablets by mouth 2 (two) times daily. 09/18/22     CONTRAVE 8-90 MG TB12 Take 2 tablets by mouth 2 (two) times daily. 09/19/22     CONTRAVE 8-90 MG TB12 Take 2 tablets by mouth 2  (two) times daily. 09/19/22     cyclobenzaprine (FLEXERIL) 10 MG tablet Take 1 tablet (10 mg total) by mouth at bedtime. 06/27/20   Nche, Bonna Gains, NP  cyclobenzaprine (FLEXERIL) 10 MG tablet Take 1 tablet (10 mg total) by mouth daily as needed for Muscle spasms. 10/24/21     cyclobenzaprine (FLEXERIL) 10 MG tablet Take 1 tablet (10 mg total) by mouth 3 (three) times daily as needed. 09/18/22     cyclobenzaprine (FLEXERIL) 10 MG tablet Take 1 tablet (10 mg total) by mouth 3 (three) times daily as needed. 04/08/23     Garlic 1000 MG CAPS     [provider]  losartan (COZAAR) 50 MG tablet Take 1 tablet (50 mg total) by mouth daily. 05/17/23     metoprolol succinate (TOPROL-XL) 100 MG 24 hr tablet Take 1 tablet (100 mg total) by mouth daily. 04/08/23     metoprolol succinate (TOPROL-XL) 25 MG 24 hr tablet Take 1 tablet (25 mg total) by mouth daily. 09/04/21     metoprolol succinate (TOPROL-XL) 50 MG 24 hr tablet Take 1 tablet (50 mg total) by mouth daily. 09/22/21     metoprolol succinate (TOPROL-XL) 50 MG 24 hr tablet Take 1 tablet (50 mg total) by mouth daily. 10/24/21     metoprolol succinate (TOPROL-XL) 50 MG 24 hr tablet Take 1.5  tablets (75 mg total) by mouth daily. 12/01/21     Multiple Vitamin (MULTIVITAMIN ADULT PO)     [provider]  naltrexone (DEPADE) 50 MG tablet Take 0.5 tablets (25 mg total) by mouth 2 (two) times daily. 05/08/22     Nutritional Supplements (VITAMIN D MAINTENANCE PO)     [provider]  Probiotic Product (PROBIOTIC PEARLS) CAPS     [provider]  Semaglutide-Weight Management (WEGOVY) 0.25 MG/0.5ML SOAJ Inject 0.5 mLs (0.25 mg total) into the skin every 7 days. For 4 weeks, then increase to 0.5 mg weekly for 4 weeks 12/25/21     Semaglutide-Weight Management (WEGOVY) 0.5 MG/0.5ML SOAJ Inject 0.5 mg into the skin once a week. Start after finishing 4 weeks of 0.25 mg 12/25/21         Allergies    Ace inhibitors    Review of Systems    Review of Systems  Physical Exam Updated Vital Signs BP (!) 187/99   Pulse (!) 112   Temp 98.8 F (37.1 C) (Oral)   Resp 16   Ht 5\' 6"  (1.676 m)   Wt (!) 199.6 kg   LMP 05/10/2023   SpO2 100%   BMI 71.02 kg/m  Physical Exam Vitals and nursing note reviewed.  HENT:     Head: Normocephalic and atraumatic.  Eyes:     Pupils: Pupils are equal, round, and reactive to light.  Cardiovascular:     Rate and Rhythm: Normal rate and regular rhythm.  Pulmonary:     Effort: Pulmonary effort is normal.     Breath sounds: Normal breath sounds.  Abdominal:     Palpations: Abdomen is soft.     Tenderness: There is no abdominal tenderness.  Skin:    General: Skin is warm and dry.  Neurological:     Mental Status: She is alert.  Psychiatric:        Mood and Affect: Mood normal.     ED Results / Procedures / Treatments   Labs (all labs ordered are listed, but only abnormal results are displayed) Labs Reviewed  COMPREHENSIVE METABOLIC PANEL WITH GFR - Abnormal; Notable for the following components:      Result Value   Glucose, Bld 124 (*)    Creatinine, Ser 1.13 (*)    GFR, Estimated 59 (*)    All other components within normal limits  CBC WITH DIFFERENTIAL/PLATELET    EKG EKG Interpretation Date/Time:  Sunday May 19 2023 12:26:38 EDT Ventricular Rate:  123 PR Interval:  146 QRS Duration:  78 QT Interval:  314 QTC Calculation: 449 R Axis:   24  Text Interpretation: Sinus tachycardia with occasional Premature ventricular complexes Otherwise normal ECG No previous ECGs available Confirmed by Estelle June [phone]) on 05/19/2023 1:58:14 PM  Radiology No results found.  Procedures Procedures  {Document cardiac monitor, telemetry assessment procedure when appropriate:1}  Medications Ordered in ED Medications - No data to display  ED Course/ Medical Decision Making/ A&P   {   Click here for ABCD2, HEART and other calculatorsREFRESH Note before signing :1}                               Medical Decision Making 53 year old female with history as above presenting for hypertension and concern of hyperkalemia based on outpatient labs from last week.  Mild headache.  No other systemic complaints at this time.  Initial blood pressure notable for systolic  of 180 and tachycardia.  She has not taken losartan today awaiting results of repeat blood work given hyperkalemia.  Well-appearing on my exam.  No evidence of endorgan damage on her laboratory workup.  EKG shows sinus tachycardia with PVCs no evidence of dysrhythmia or ischemia.  Hyper kalemia may have been in setting of laboratory error as potassium is within normal limits here today.  Blood pressure was in the 140 systolic on my assessment.  Headache has resolved.  She will follow-up with her primary care doctor and start taking losartan.  Amount and/or Complexity of Data Reviewed Labs: ordered.     {Document critical care time when appropriate:1} {Document review of labs and clinical decision tools ie heart score, Chads2Vasc2 etc:1}  {Document your independent review of radiology images, and any outside records:1} {Document your discussion with family members, caretakers, and with consultants:1} {Document social determinants of health affecting pt's care:1} {Document your decision making why or why not admission, treatments were needed:1} Final Clinical Impression(s) / ED Diagnoses Final diagnoses:  Secondary hypertension    Rx / DC Orders ED Discharge Orders     None

## 2023-05-19 NOTE — ED Triage Notes (Signed)
 Pt reports elevated BP since Tues; saw PCP Friday and they added Losartan 50mg  to her regimen; sts her K+ was 5.4, so she did not take the Losartan today; denies sxs

## 2023-05-19 NOTE — Discharge Instructions (Signed)
 You were seen in the emergency room for high blood pressure and concern of high potassium Your potassium was within normal limits Your blood pressure came down to the 140s in the ED Continue checking blood pressure at home and start taking the losartan as previously prescribed Follow-up with your doctor for reevaluation to have your blood pressure and labs rechecked Continue monitoring your blood pressure at home Return to the emergency department for chest pain, severe headaches or any other concerns

## 2023-05-20 ENCOUNTER — Encounter: Payer: Self-pay | Admitting: Obstetrics and Gynecology

## 2023-05-20 ENCOUNTER — Ambulatory Visit: Payer: Commercial Managed Care - PPO | Admitting: Obstetrics and Gynecology

## 2023-05-20 ENCOUNTER — Other Ambulatory Visit (HOSPITAL_COMMUNITY)
Admission: RE | Admit: 2023-05-20 | Discharge: 2023-05-20 | Disposition: A | Discharge status: Home / Self Care | Source: Ambulatory Visit | Attending: Obstetrics and Gynecology | Admitting: Obstetrics and Gynecology

## 2023-05-20 ENCOUNTER — Other Ambulatory Visit (HOSPITAL_COMMUNITY): Payer: Self-pay

## 2023-05-20 VITALS — BP 138/90 | HR 116 | Ht 66.0 in | Wt >= 6400 oz

## 2023-05-20 DIAGNOSIS — Z124 Encounter for screening for malignant neoplasm of cervix: Secondary | ICD-10-CM | POA: Diagnosis not present

## 2023-05-20 DIAGNOSIS — N939 Abnormal uterine and vaginal bleeding, unspecified: Secondary | ICD-10-CM | POA: Diagnosis not present

## 2023-05-20 DIAGNOSIS — Z113 Encounter for screening for infections with a predominantly sexual mode of transmission: Secondary | ICD-10-CM

## 2023-05-20 DIAGNOSIS — Z1339 Encounter for screening examination for other mental health and behavioral disorders: Secondary | ICD-10-CM

## 2023-05-20 DIAGNOSIS — Z01419 Encounter for gynecological examination (general) (routine) without abnormal findings: Secondary | ICD-10-CM

## 2023-05-20 MED ORDER — AMLODIPINE BESYLATE 5 MG PO TABS
5.0000 mg | ORAL_TABLET | Freq: Every day | ORAL | 1 refills | Status: DC
  Filled 2023-05-20 – 2023-05-22 (×2): qty 30, 30d supply, fill #0

## 2023-05-20 NOTE — Progress Notes (Signed)
 ANNUAL EXAM Patient name: Alice Silva MRN [id number]  Date of birth: [birth year] Chief Complaint:   No chief complaint on file.  History of Present Illness:   Alice Silva is a 53 y.o. No obstetric history on file. being seen today for a routine annual exam.  Current complaints: annual   Discussed the use of AI scribe software for clinical note transcription with the patient, who gave verbal consent to proceed.  History of Present Illness Alice Silva is a 53 year old female with a history of abnormal Pap smears and perimenopausal symptoms who presents for an annual exam and Pap smear.  She has a history of abnormal Pap smears in 2017 and 2018, which led to a LEEP procedure in 2019. The LEEP revealed CIN 3 cells endocervically, although prior biopsies were negative. Since then, she has had annual Pap smears, with the most recent in January 2024 showing atypical cells. She is concerned about the risk of cervical cancer.  She experiences perimenopausal symptoms, including irregular periods. Her menstrual cycles have been as long as five months apart, and she describes episodes of heavy bleeding, which once nearly prompted her to visit the emergency room. She denies any pain but is significantly frustrated with the bleeding and the anxiety of potential cervical cancer recurrence.  She is currently sexually active with a female partner and reports no pain with intercourse, although she does experience bleeding afterward. She has not had any imaging studies related to her bleeding with her previous gynecologist, despite discussions about possibly scheduling an ultrasound. No pain associated with her fibroglandular breast mass.    Patient's last menstrual period was 05/10/2023.   The pregnancy intention screening data noted above was reviewed. Potential methods of contraception were discussed. The patient elected to proceed with No data recorded.   Last pap 11/2019 NILM, HR HPV positive Last  mammogram: 12/2020 BIRADS 3.  Last colonoscopy: 04/2023 cologaurd negative.      03/27/2021    1:54 PM  Depression screen PHQ 2/9  Decreased Interest 0  Down, Depressed, Hopeless 0  PHQ - 2 Score 0        05/20/2023   10:50 AM  GAD 7 : Generalized Anxiety Score  Nervous, Anxious, on Edge 1  Control/stop worrying 0  Worry too much - different things 0  Trouble relaxing 0  Restless 0  Easily annoyed or irritable 0  Afraid - awful might happen 0  Total GAD 7 Score 1     Review of Systems:   Pertinent items are noted in HPI Denies any headaches, blurred vision, fatigue, shortness of breath, chest pain, abdominal pain, abnormal vaginal discharge/itching/odor/irritation, problems with periods, bowel movements, urination, or intercourse unless otherwise stated above. Pertinent History Reviewed:  Reviewed past medical,surgical, social and family history.  Reviewed problem list, medications and allergies. Physical Assessment:  There were no vitals filed for this visit.There is no height or weight on file to calculate BMI.        Physical Examination:   General appearance - well appearing, and in no distress  Mental status - alert, oriented to person, place, and time  Psych:  She has a normal mood and affect  Skin - warm and dry, normal color, no suspicious lesions noted  Chest - effort normal, all lung fields clear to auscultation bilaterally  Heart - normal rate and regular rhythm  Breasts - breasts appear normal, no suspicious masses, no skin or nipple changes or  axillary nodes  Abdomen -  soft, nontender, nondistended, no masses or organomegaly  Pelvic -  VULVA: normal appearing vulva with no masses, tenderness or lesions   VAGINA: normal appearing vagina with normal color and discharge, no lesions   CERVIX: not fully visualized  Thin prep pap is done with HR HPV cotesting  Extremities:  No swelling or varicosities noted  Chaperone present for exam  Results for orders  placed or performed during the hospital encounter of 05/19/23 (from the past 24 hours)  Comprehensive metabolic panel   Collection Time: 05/19/23  1:03 PM  Result Value Ref Range   Sodium 137 135 - 145 mmol/L   Potassium 4.0 3.5 - 5.1 mmol/L   Chloride 100 98 - 111 mmol/L   CO2 23 22 - 32 mmol/L   Glucose, Bld 124 (H) 70 - 99 mg/dL   BUN 15 6 - 20 mg/dL   Creatinine, Ser 4.13 (H) 0.44 - 1.00 mg/dL   Calcium 9.1 8.9 - 24.4 mg/dL   Total Protein 7.8 6.5 - 8.1 g/dL   Albumin 3.9 3.5 - 5.0 g/dL   AST 23 15 - 41 U/L   ALT 21 0 - 44 U/L   Alkaline Phosphatase 77 38 - 126 U/L   Total Bilirubin 0.4 0.0 - 1.2 mg/dL   GFR, Estimated 59 (L) >60 mL/min   Anion gap 14 5 - 15  CBC with Differential   Collection Time: 05/19/23  1:03 PM  Result Value Ref Range   WBC 9.6 4.0 - 10.5 K/uL   RBC 4.55 3.87 - 5.11 MIL/uL   Hemoglobin 13.3 12.0 - 15.0 g/dL   HCT 01.0 27.2 - 53.6 %   MCV 89.5 80.0 - 100.0 fL   MCH 29.2 26.0 - 34.0 pg   MCHC 32.7 30.0 - 36.0 g/dL   RDW 64.4 03.4 - 74.2 %   Platelets 393 150 - 400 K/uL   nRBC 0.0 0.0 - 0.2 %   Neutrophils Relative % 67 %   Neutro Abs 6.5 1.7 - 7.7 K/uL   Lymphocytes Relative 24 %   Lymphs Abs 2.3 0.7 - 4.0 K/uL   Monocytes Relative 6 %   Monocytes Absolute 0.6 0.1 - 1.0 K/uL   Eosinophils Relative 1 %   Eosinophils Absolute 0.1 0.0 - 0.5 K/uL   Basophils Relative 1 %   Basophils Absolute 0.1 0.0 - 0.1 K/uL   Immature Granulocytes 1 %   Abs Immature Granulocytes 0.05 0.00 - 0.07 K/uL      Assessment & Plan:  Assessment and Plan Assessment & Plan Abnormal Pap smear Abnormal Pap smears in 2017 and 2018, with a LEEP procedure in 2019 revealing CIN 3 cells endocervically. Recent Pap smear in January 2024 showed atypical cells, not concerning to the previous provider. She desires hysterectomy due to fear of cervical cancer recurrence and irregular bleeding. She understands that Pap smears will still be necessary post-hysterectomy and is  comfortable with this. - Pap smear completed today - Review previous medical records for past Pap smear results - Discuss hysterectomy after review of labs and [place]  Irregular menstrual bleeding Irregular and heavy menstrual bleeding, with intervals between periods ranging from two to five months. Bleeding is heavy enough to consider emergency care. She is perimenopausal and desires resolution of bleeding issues. An ultrasound was previously discussed but not performed at her prior OBGYN office. - Order pelvic ultrasound to evaluate the cause of irregular bleeding - Discuss results and management options at follow-up visit  Sexually transmitted infection (  STI) screening Sexually active with a female partner and requests STI testing, including HIV and syphilis, during the visit. - Perform STI testing, including HIV and syphilis, during today's visit   Orders Placed This Encounter  Procedures   MM 3D SCREENING MAMMOGRAM BILATERAL BREAST   US PELVIC COMPLETE WITH TRANSVAGINAL   Prolactin   Hemoglobin A1c   TSH Rfx on Abnormal to Free T4   RPR+HBsAg+HCVAb+...    Meds: No orders of the defined types were placed in this encounter.   Follow-up: No follow-ups on file.  Lorriane Shire, MD 05/20/2023 7:58 AM

## 2023-05-22 ENCOUNTER — Encounter: Payer: Self-pay | Admitting: Orthopedic Surgery

## 2023-05-22 LAB — CYTOLOGY - PAP
Adequacy: ABSENT
Chlamydia: NEGATIVE
Comment: NEGATIVE
Comment: NEGATIVE
Comment: NEGATIVE
Comment: NORMAL
Diagnosis: NEGATIVE
High risk HPV: NEGATIVE
Neisseria Gonorrhea: NEGATIVE
Trichomonas: NEGATIVE

## 2023-05-23 ENCOUNTER — Encounter: Payer: Self-pay | Admitting: Obstetrics and Gynecology

## 2023-05-23 ENCOUNTER — Other Ambulatory Visit: Payer: Self-pay

## 2023-05-23 ENCOUNTER — Other Ambulatory Visit (HOSPITAL_COMMUNITY): Payer: Self-pay

## 2023-05-23 ENCOUNTER — Encounter: Payer: Self-pay | Admitting: Pharmacist

## 2023-05-23 DIAGNOSIS — B9689 Other specified bacterial agents as the cause of diseases classified elsewhere: Secondary | ICD-10-CM

## 2023-05-23 MED ORDER — METRONIDAZOLE 500 MG PO TABS
500.0000 mg | ORAL_TABLET | Freq: Two times a day (BID) | ORAL | 0 refills | Status: DC
  Filled 2023-05-23: qty 14, 7d supply, fill #0

## 2023-05-23 MED ORDER — AMLODIPINE BESYLATE 5 MG PO TABS: 5.0000 mg | ORAL_TABLET | Freq: Every day | ORAL | 3 refills | Status: DC

## 2023-05-23 NOTE — Telephone Encounter (Signed)
 VOB has been submitted for Monovisc, bilateral knee

## 2023-05-23 NOTE — Progress Notes (Signed)
 Patient's pap smear was positive for BV. Patient sent a Mychart message requesting a Rx for BV.Flagyl 500 mg BID x 7 days was sent to her pharmacy. Yassir Enis l Zaire Levesque, CMA

## 2023-05-27 ENCOUNTER — Other Ambulatory Visit: Payer: Self-pay

## 2023-05-27 ENCOUNTER — Telehealth: Payer: Self-pay

## 2023-05-27 DIAGNOSIS — M170 Bilateral primary osteoarthritis of knee: Secondary | ICD-10-CM

## 2023-05-27 NOTE — Telephone Encounter (Signed)
 Gel injection has been approved. See referrals tab

## 2023-05-29 ENCOUNTER — Telehealth (HOSPITAL_BASED_OUTPATIENT_CLINIC_OR_DEPARTMENT_OTHER): Payer: Self-pay

## 2023-06-05 ENCOUNTER — Encounter: Payer: Self-pay | Admitting: Orthopedic Surgery

## 2023-06-05 ENCOUNTER — Ambulatory Visit: Admitting: Orthopedic Surgery

## 2023-06-05 DIAGNOSIS — M170 Bilateral primary osteoarthritis of knee: Secondary | ICD-10-CM | POA: Diagnosis not present

## 2023-06-05 MED ORDER — LIDOCAINE HCL 1 % IJ SOLN
5.0000 mL | INTRAMUSCULAR | Status: AC | PRN
  Administered 2023-06-05: 5 mL

## 2023-06-05 MED ORDER — HYALURONAN 88 MG/4ML IX SOSY
88.0000 mg | PREFILLED_SYRINGE | INTRA_ARTICULAR | Status: AC | PRN
  Administered 2023-06-05: 88 mg via INTRA_ARTICULAR

## 2023-06-05 NOTE — Progress Notes (Signed)
   Procedure Note  Patient: Alice Silva             Date of Birth: 27-Jan-1971           MRN: [id number]             Visit Date: 06/05/2023  Procedures: Visit Diagnoses:  1. Primary osteoarthritis of both knees     Large Joint Inj: bilateral knee on 06/05/2023 9:21 PM Indications: pain, joint swelling and diagnostic evaluation Details: 18 G 1.5 in needle, superolateral approach  Arthrogram: No  Medications (Right): 5 mL lidocaine 1 %; 88 mg Hyaluronan 88 MG/4ML Medications (Left): 5 mL lidocaine 1 %; 88 mg Hyaluronan 88 MG/4ML Outcome: tolerated well, no immediate complications Procedure, treatment alternatives, risks and benefits explained, specific risks discussed. Consent was given by the patient. Immediately prior to procedure a time out was called to verify the correct patient, procedure, equipment, support staff and site/side marked as required. Patient was prepped and draped in the usual sterile fashion.      Good relief from cortisone but now wearing off

## 2023-06-10 ENCOUNTER — Other Ambulatory Visit: Payer: Self-pay

## 2023-06-10 ENCOUNTER — Ambulatory Visit (HOSPITAL_BASED_OUTPATIENT_CLINIC_OR_DEPARTMENT_OTHER)

## 2023-06-10 ENCOUNTER — Other Ambulatory Visit (HOSPITAL_COMMUNITY): Payer: Self-pay

## 2023-06-10 DIAGNOSIS — E7800 Pure hypercholesterolemia, unspecified: Secondary | ICD-10-CM | POA: Diagnosis not present

## 2023-06-10 DIAGNOSIS — M170 Bilateral primary osteoarthritis of knee: Secondary | ICD-10-CM | POA: Diagnosis not present

## 2023-06-10 DIAGNOSIS — R7301 Impaired fasting glucose: Secondary | ICD-10-CM | POA: Diagnosis not present

## 2023-06-10 DIAGNOSIS — R0683 Snoring: Secondary | ICD-10-CM | POA: Diagnosis not present

## 2023-06-10 DIAGNOSIS — I10 Essential (primary) hypertension: Secondary | ICD-10-CM | POA: Diagnosis not present

## 2023-06-10 MED ORDER — LOSARTAN POTASSIUM 100 MG PO TABS
100.0000 mg | ORAL_TABLET | Freq: Every day | ORAL | 1 refills | Status: DC
  Filled 2023-06-10: qty 90, 90d supply, fill #0

## 2023-06-10 MED ORDER — HYDROCHLOROTHIAZIDE 12.5 MG PO CAPS
12.5000 mg | ORAL_CAPSULE | Freq: Every day | ORAL | 5 refills | Status: DC
  Filled 2023-06-10: qty 30, 30d supply, fill #0
  Filled 2023-07-24: qty 30, 30d supply, fill #1

## 2023-06-17 ENCOUNTER — Inpatient Hospital Stay (HOSPITAL_BASED_OUTPATIENT_CLINIC_OR_DEPARTMENT_OTHER): Admission: RE | Admit: 2023-06-17 | Source: Ambulatory Visit

## 2023-06-17 ENCOUNTER — Ambulatory Visit (HOSPITAL_BASED_OUTPATIENT_CLINIC_OR_DEPARTMENT_OTHER): Admission: RE | Admit: 2023-06-17 | Source: Ambulatory Visit

## 2023-06-17 ENCOUNTER — Encounter (HOSPITAL_BASED_OUTPATIENT_CLINIC_OR_DEPARTMENT_OTHER): Payer: Self-pay

## 2023-07-10 ENCOUNTER — Other Ambulatory Visit (HOSPITAL_COMMUNITY): Payer: Self-pay

## 2023-07-25 ENCOUNTER — Other Ambulatory Visit (HOSPITAL_COMMUNITY): Payer: Self-pay

## 2023-07-30 DIAGNOSIS — R0609 Other forms of dyspnea: Secondary | ICD-10-CM | POA: Diagnosis not present

## 2023-07-30 DIAGNOSIS — R1900 Intra-abdominal and pelvic swelling, mass and lump, unspecified site: Secondary | ICD-10-CM | POA: Diagnosis not present

## 2023-07-30 DIAGNOSIS — Z9884 Bariatric surgery status: Secondary | ICD-10-CM | POA: Diagnosis not present

## 2023-07-30 DIAGNOSIS — K219 Gastro-esophageal reflux disease without esophagitis: Secondary | ICD-10-CM | POA: Diagnosis not present

## 2023-07-30 DIAGNOSIS — M170 Bilateral primary osteoarthritis of knee: Secondary | ICD-10-CM | POA: Diagnosis not present

## 2023-07-30 DIAGNOSIS — I10 Essential (primary) hypertension: Secondary | ICD-10-CM | POA: Diagnosis not present

## 2023-07-30 DIAGNOSIS — E559 Vitamin D deficiency, unspecified: Secondary | ICD-10-CM | POA: Diagnosis not present

## 2023-07-30 DIAGNOSIS — R7309 Other abnormal glucose: Secondary | ICD-10-CM | POA: Diagnosis not present

## 2023-08-09 ENCOUNTER — Other Ambulatory Visit (HOSPITAL_COMMUNITY): Payer: Self-pay | Admitting: General Surgery

## 2023-08-09 DIAGNOSIS — R1900 Intra-abdominal and pelvic swelling, mass and lump, unspecified site: Secondary | ICD-10-CM

## 2023-08-12 ENCOUNTER — Institutional Professional Consult (permissible substitution): Admitting: Nurse Practitioner

## 2023-08-12 ENCOUNTER — Other Ambulatory Visit (HOSPITAL_COMMUNITY): Payer: Self-pay | Admitting: General Surgery

## 2023-08-22 ENCOUNTER — Telehealth: Payer: Self-pay

## 2023-08-22 NOTE — Telephone Encounter (Signed)
 Left voice message for pt to call back regarding an appointment with Dr. Lavona.

## 2023-08-23 IMAGING — US US BREAST*L* LIMITED INC AXILLA
1 series · 1 of 1 positions shown · non-contrast
Comparison: Previous exam(s).
COMPARISON: Previous exam(s).

Addendum:
CLINICAL DATA: 50-year-old female presenting as a recall from
screening for possible left breast mass.

EXAM:
DIGITAL DIAGNOSTIC UNILATERAL LEFT MAMMOGRAM WITH TOMOSYNTHESIS AND
CAD; ULTRASOUND LEFT BREAST LIMITED
TECHNIQUE: Left digital diagnostic mammography and breast tomosynthesis was
performed. The images were evaluated with computer-aided detection.;
Targeted ultrasound examination of the left breast was performed.

[Series 1: us breast*left* limited inc axilla · 0.08mm/px · 1 of 1 slices shown]
[im 1/1]
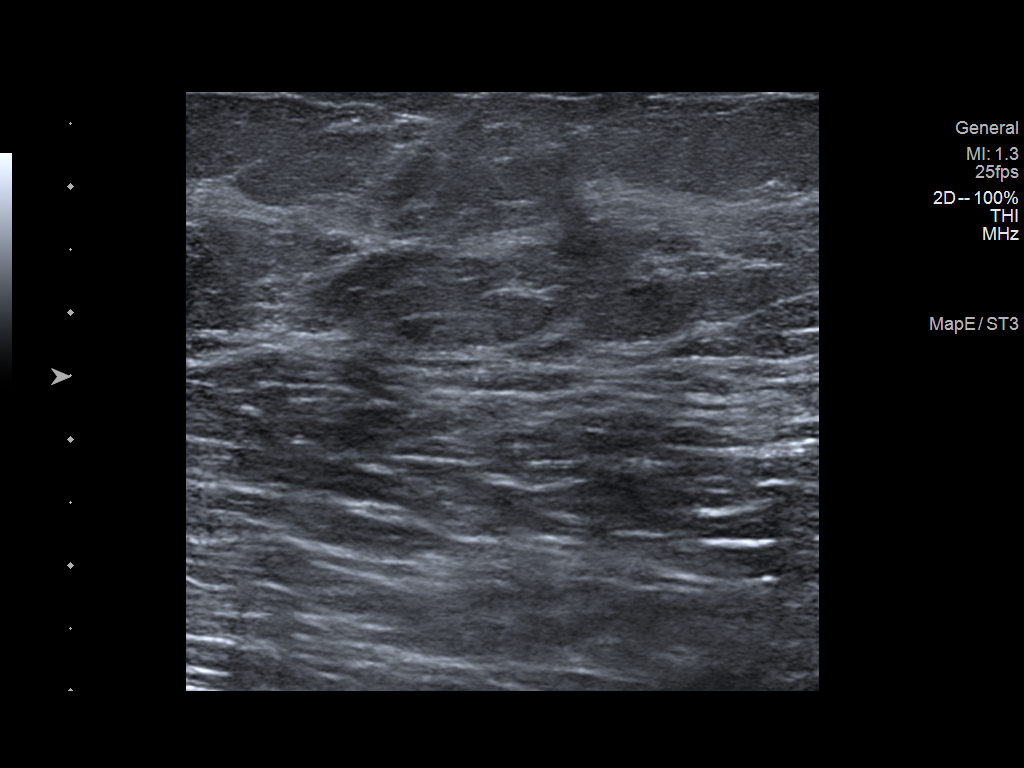

[1 of 1 positions shown; findings below may reference images not displayed]

ACR Breast Density Category b: There are scattered areas of
fibroglandular density.
FINDINGS: Mammogram:

Spot compression tomosynthesis cc and full field mL tomosynthesis
views of the left breast were performed demonstrating persistence of
an oval mass in the upper inner left breast measuring approximately
0.9 cm.

Ultrasound:

Targeted ultrasound performed throughout the upper inner quadrant of
the left breast demonstrating no definite sonographic correlate to
the mammographic finding.
IMPRESSION: Probably benign mass without sonographic correlate in the upper
inner left breast.

RECOMMENDATION:
Recommend obtaining patient's prior outside mammograms for
comparison. If the left breast mass is stable recommend patient
return to routine annual screening mammography. If the left breast
mass is bigger or new recommend stereotactic core needle biopsy. If
the the outside mammograms cannot be obtained recommend diagnostic
left breast mammogram in 6 months.

I have discussed the findings and recommendations with the patient.
If applicable, a reminder letter will be sent to the patient
regarding the next appointment.

BI-RADS CATEGORY  3: Probably benign.

ADDENDUM:
Prior mammograms dating back to 0963 have been obtained. The mass in
the upper inner left breast appears stable dating back to 2727,
consistent with a benign process.

BI-RADS 2-benign

Recommendation:  Screening mammogram in one year.(Code:94-R-03V)

*** End of Addendum ***
ACR Breast Density Category b: There are scattered areas of
fibroglandular density.
FINDINGS: Mammogram:

Spot compression tomosynthesis cc and full field mL tomosynthesis
views of the left breast were performed demonstrating persistence of
an oval mass in the upper inner left breast measuring approximately
0.9 cm.

Ultrasound:

Targeted ultrasound performed throughout the upper inner quadrant of
the left breast demonstrating no definite sonographic correlate to
the mammographic finding.
IMPRESSION: Probably benign mass without sonographic correlate in the upper
inner left breast.

RECOMMENDATION:
Recommend obtaining patient's prior outside mammograms for
comparison. If the left breast mass is stable recommend patient
return to routine annual screening mammography. If the left breast
mass is bigger or new recommend stereotactic core needle biopsy. If
the the outside mammograms cannot be obtained recommend diagnostic
left breast mammogram in 6 months.

I have discussed the findings and recommendations with the patient.
If applicable, a reminder letter will be sent to the patient
regarding the next appointment.

BI-RADS CATEGORY  3: Probably benign.

## 2023-08-26 ENCOUNTER — Other Ambulatory Visit: Payer: Self-pay | Admitting: Obstetrics and Gynecology

## 2023-08-26 ENCOUNTER — Telehealth: Payer: Self-pay

## 2023-08-26 DIAGNOSIS — Z1231 Encounter for screening mammogram for malignant neoplasm of breast: Secondary | ICD-10-CM

## 2023-08-26 NOTE — Telephone Encounter (Signed)
-----   Message from Lynwood Schilling sent at 08/22/2023  1:38 PM EDT ----- Regarding: RE: needs cardiology appt Absolutely,  We will get her in next week.    Aleck,  If you cannot find anything let me know and I will look at schedules.  Please let me know when she is scheduled. ----- Message ----- From: Tanda Locus, MD Sent: 08/22/2023  11:20 AM EDT To: Lynwood Schilling, MD; [place] L [place] Subject: needs cardiology appt                          Hi  Was wondering if you could help facilitate getting this bariatric pt a cardiology evaluation.  She is in our pipeline for revisional bariatric surgery - converting lap band to sleeve.   She recently was placed on 3 antiHTN, gets DOE, and SOB with ADLs.  We are being told first available appt is in late September. Pt not able to drive to [place] for the earlier appt that was offered.   Not looking for a stat appt but was hoping for something for at least in August.   She is a [employer] employee and inquired about going outside the system for cardiology evaluation.   Thanks Locus Locus M. Tanda, MD, FACS General, Bariatric, & Minimally Invasive Surgery [facility] Surgery,  A [facility]

## 2023-08-26 NOTE — Telephone Encounter (Signed)
 Spoke with pt regarding an appointment. Notified pt that a message was sent to scheduling team to find her a spot in August or sooner. Pt verbalized understanding. All questions if any were answered.

## 2023-08-27 ENCOUNTER — Telehealth: Payer: Self-pay | Admitting: *Deleted

## 2023-08-27 NOTE — Telephone Encounter (Signed)
 LMOVM requesting call back to schedule appointment. Direct number provided.  Per Kerri, [facility] Team Lead, will plan to offer a [facility] slot.

## 2023-08-27 NOTE — Telephone Encounter (Signed)
-----   Message from Lynwood Schilling sent at 08/27/2023  9:58 AM EDT ----- Regarding: FW: needs cardiology appt Aleck,  I will get Damien to help you.  She knows all of the nuances and back doors.  Thanks for trying. ----- Message ----- From: Sanjuan Aleck SAILOR, RN Sent: 08/26/2023   4:27 PM EDT To: Lynwood Schilling, MD Subject: FW: needs cardiology appt                      I called the patient today and sent a message to scheduling to find her an appointment with one of our providers asap. They are usually pretty quick. I am planning to keep an eye out and will let you know when the appointment is made. Let me know if there is something else I need to do. ----- Message ----- From: Schilling Lynwood, MD Sent: 08/26/2023   3:03 PM EDT To: Aleck SAILOR Schlanger, RN Subject: FW: needs cardiology appt                      Hi,  If you are working today can you see if somebody in triage can take care of this today.  If you guys cannot get her a spot I will talk see what I can do by talking to my partners who are in the office this week.  Thanks. ----- Message ----- From: Tanda Locus, MD Sent: 08/22/2023  11:20 AM EDT To: Lynwood Schilling, MD; [place] Subject: needs cardiology appt                          Hi  Was wondering if you could help facilitate getting this bariatric pt a cardiology evaluation.  She is in our pipeline for revisional bariatric surgery - converting lap band to sleeve.   She recently was placed on 3 antiHTN, gets DOE, and SOB with ADLs.  We are being told first available appt is in late September. Pt not able to drive to [place] for the earlier appt that was offered.   Not looking for a stat appt but was hoping for something for at least in August.   She is a [employer] employee and inquired about going outside the system for cardiology evaluation.   Thanks Locus Locus M. Tanda, MD, FACS General, Bariatric, & Minimally Invasive Surgery [facility],  A [facility]

## 2023-08-28 NOTE — Telephone Encounter (Signed)
 Second attempt:  LMOVM requesting call back. Direct number provided.

## 2023-08-29 NOTE — Telephone Encounter (Signed)
 Third attempt:  LMOVM requesting call back. Direct number provided. Advised will also send available appointment slots via MyChart message.

## 2023-09-02 ENCOUNTER — Telehealth: Payer: Self-pay

## 2023-09-02 ENCOUNTER — Ambulatory Visit (HOSPITAL_COMMUNITY)
Admission: RE | Admit: 2023-09-02 | Discharge: 2023-09-02 | Disposition: A | Discharge status: Home / Self Care | Source: Ambulatory Visit | Attending: General Surgery | Admitting: General Surgery

## 2023-09-02 ENCOUNTER — Ambulatory Visit: Admitting: Orthopedic Surgery

## 2023-09-02 DIAGNOSIS — D259 Leiomyoma of uterus, unspecified: Secondary | ICD-10-CM | POA: Diagnosis not present

## 2023-09-02 DIAGNOSIS — R1900 Intra-abdominal and pelvic swelling, mass and lump, unspecified site: Secondary | ICD-10-CM | POA: Diagnosis not present

## 2023-09-02 DIAGNOSIS — Z01818 Encounter for other preprocedural examination: Secondary | ICD-10-CM | POA: Diagnosis not present

## 2023-09-02 DIAGNOSIS — Z4682 Encounter for fitting and adjustment of non-vascular catheter: Secondary | ICD-10-CM | POA: Diagnosis not present

## 2023-09-02 DIAGNOSIS — K8020 Calculus of gallbladder without cholecystitis without obstruction: Secondary | ICD-10-CM | POA: Diagnosis not present

## 2023-09-02 DIAGNOSIS — K429 Umbilical hernia without obstruction or gangrene: Secondary | ICD-10-CM | POA: Diagnosis not present

## 2023-09-02 DIAGNOSIS — M170 Bilateral primary osteoarthritis of knee: Secondary | ICD-10-CM

## 2023-09-02 MED ORDER — IOHEXOL 300 MG/ML  SOLN
100.0000 mL | Freq: Once | INTRAMUSCULAR | Status: AC | PRN
  Administered 2023-09-02: 100 mL via INTRAVENOUS

## 2023-09-02 NOTE — Telephone Encounter (Signed)
 Auth needed for bil knee gel

## 2023-09-03 ENCOUNTER — Encounter: Payer: Self-pay | Admitting: Orthopedic Surgery

## 2023-09-03 ENCOUNTER — Ambulatory Visit: Admitting: Physician Assistant

## 2023-09-03 NOTE — Progress Notes (Unsigned)
 Office Visit Note   Patient: Alice Silva           Date of Birth: 1971-01-26           MRN: [id number] Visit Date: 09/02/2023 Requested by: No referring provider defined for this encounter. PCP: Gretel Piedra, NP  Subjective: Chief Complaint  Patient presents with   Right Knee - Pain   Left Knee - Pain    HPI: Alice Silva is a 53 y.o. female who presents to the office reporting bilateral knee pain.  She last had gel injections which gave her some relief from pain.  Cortisone injections worked better.  No interval history of injury or trauma.  She continues to work as a Engineer, civil (consulting) remotely and critical care..                ROS: All systems reviewed are negative as they relate to the chief complaint within the history of present illness.  Patient denies fevers or chills.  Assessment & Plan: Visit Diagnoses:  1. Primary osteoarthritis of both knees     Plan: Impression is bilateral knee arthritis.  No effusion in either knee today.  Range of motion essentially the same.  Bilateral knee injections are performed.  At this time we are going to go on scheduled every 3 month alternating gel and cortisone injections just to see if we can get her overall baseline amount of pain decreased by some percentage.  Follow-Up Instructions: No follow-ups on file.   Orders:  No orders of the defined types were placed in this encounter.  No orders of the defined types were placed in this encounter.     Procedures: Large Joint Inj: bilateral knee on 09/02/2023 11:09 AM Indications: diagnostic evaluation, joint swelling and pain Details: 18 G 1.5 in needle, superolateral approach  Arthrogram: No  Medications (Right): 5 mL lidocaine  1 %; 4 mL bupivacaine  0.25 %; 40 mg triamcinolone  acetonide 40 MG/ML Medications (Left): 5 mL lidocaine  1 %; 4 mL bupivacaine  0.25 %; 40 mg triamcinolone  acetonide 40 MG/ML Outcome: tolerated well, no immediate complications Procedure, treatment alternatives, risks  and benefits explained, specific risks discussed. Consent was given by the patient. Immediately prior to procedure a time out was called to verify the correct patient, procedure, equipment, support staff and site/side marked as required. Patient was prepped and draped in the usual sterile fashion.     This patient is diagnosed with osteoarthritis of the knee(s).    Radiographs show evidence of joint space narrowing, osteophytes, subchondral sclerosis and/or subchondral cysts.  This patient has knee pain which interferes with functional and activities of daily living.    This patient has experienced inadequate response, adverse effects and/or intolerance with conservative treatments such as acetaminophen, NSAIDS, topical creams, physical therapy or regular exercise, knee bracing and/or weight loss.   This patient has experienced inadequate response or has a contraindication to intra articular steroid injections for at least 3 months.   This patient is not scheduled to have a total knee replacement within 6 months of starting treatment with viscosupplementation.   Clinical Data: No additional findings.  Objective: Vital Signs: There were no vitals taken for this visit.  Physical Exam:  Constitutional: Patient appears well-developed HEENT:  Head: Normocephalic Eyes:EOM are normal Neck: Normal range of motion Cardiovascular: Normal rate Pulmonary/chest: Effort normal Neurologic: Patient is alert Skin: Skin is warm Psychiatric: Patient has normal mood and affect  Ortho Exam: Ortho exam demonstrates minimal effusion in either knee.  Ankle dorsiflexion  intact.  Does not have quite 90 degrees of flexion in either knee.  No groin pain with internal or external rotation of the leg.  Specialty Comments:  No specialty comments available.  Imaging: No results found.   PMFS History: Patient Active Problem List   Diagnosis Date Noted   Chronic back pain 06/27/2020   History of  prolactinoma 06/27/2020   Pain in both knees 10/29/2018   Primary osteoarthritis of both knees 10/29/2018   LAP-BAND surgery status 03/12/2018   Pituitary microadenoma (HCC) 02/26/2018   Vitamin D deficiency 02/26/2018   Essential hypertension 03/24/2013   Morbid obesity (HCC) 03/24/2013   Uterine leiomyoma 03/24/2013   Past Medical History:  Diagnosis Date   Arthritis    GERD with esophagitis    History of gallstones    Hypertension     Family History  Problem Relation Age of Onset   Hyperlipidemia Mother    Depression Mother    Hyperlipidemia Father    Alcohol  abuse Sister        cirrhosis   Dementia Maternal Uncle 34   Cancer Paternal Aunt 38       Breast   Cancer Maternal Grandmother 56       pancreatic   Stroke Paternal Grandmother    Heart disease Paternal Grandmother 61       MI    Past Surgical History:  Procedure Laterality Date   CERVICAL BIOPSY  W/ LOOP ELECTRODE EXCISION     LAPAROSCOPIC GASTRIC BANDING     TONSILLECTOMY AND ADENOIDECTOMY     TUBAL LIGATION     Social History   Occupational History    Comment: RN- ICU  Tobacco Use   Smoking status: Never   Smokeless tobacco: Never  Vaping Use   Vaping status: Never Used  Substance and Sexual Activity   Alcohol  use: Not on file    Comment: social   Drug use: Never   Sexual activity: Yes    Birth control/protection: Surgical    Comment: tubal ligation

## 2023-09-04 MED ORDER — BUPIVACAINE HCL 0.25 % IJ SOLN
4.0000 mL | INTRAMUSCULAR | Status: AC | PRN
  Administered 2023-09-02: 4 mL via INTRA_ARTICULAR

## 2023-09-04 MED ORDER — LIDOCAINE HCL 1 % IJ SOLN
5.0000 mL | INTRAMUSCULAR | Status: AC | PRN
  Administered 2023-09-02: 5 mL

## 2023-09-04 MED ORDER — TRIAMCINOLONE ACETONIDE 40 MG/ML IJ SUSP
40.0000 mg | INTRAMUSCULAR | Status: AC | PRN
  Administered 2023-09-02: 40 mg via INTRA_ARTICULAR

## 2023-09-04 MED ORDER — TRIAMCINOLONE ACETONIDE 40 MG/ML IJ SUSP
40.0000 mg | INTRAMUSCULAR | Status: AC | PRN
Start: 2023-09-02 — End: 2023-09-02
  Administered 2023-09-02: 40 mg via INTRA_ARTICULAR

## 2023-09-04 NOTE — Telephone Encounter (Signed)
 Patient has been scheduled for an appointment with Hao Meng, PA, on 09/10/23 at 9:00am.

## 2023-09-05 ENCOUNTER — Ambulatory Visit: Admitting: Cardiology

## 2023-09-05 NOTE — Telephone Encounter (Signed)
 VOB submitted for Monovisc, bilateral knee  Next available appt.needs to be after 12/05/2023.

## 2023-09-09 ENCOUNTER — Ambulatory Visit
Admission: RE | Admit: 2023-09-09 | Discharge: 2023-09-09 | Disposition: A | Discharge status: Home / Self Care | Source: Ambulatory Visit

## 2023-09-09 DIAGNOSIS — Z1231 Encounter for screening mammogram for malignant neoplasm of breast: Secondary | ICD-10-CM | POA: Diagnosis not present

## 2023-09-10 ENCOUNTER — Ambulatory Visit: Attending: Internal Medicine | Admitting: Physician Assistant

## 2023-09-10 ENCOUNTER — Other Ambulatory Visit (HOSPITAL_BASED_OUTPATIENT_CLINIC_OR_DEPARTMENT_OTHER): Payer: Self-pay

## 2023-09-10 ENCOUNTER — Other Ambulatory Visit (HOSPITAL_COMMUNITY): Payer: Self-pay

## 2023-09-10 VITALS — BP 160/90 | HR 86 | Ht 66.0 in | Wt >= 6400 oz

## 2023-09-10 DIAGNOSIS — R0602 Shortness of breath: Secondary | ICD-10-CM

## 2023-09-10 DIAGNOSIS — Z01810 Encounter for preprocedural cardiovascular examination: Secondary | ICD-10-CM

## 2023-09-10 DIAGNOSIS — I10 Essential (primary) hypertension: Secondary | ICD-10-CM | POA: Diagnosis not present

## 2023-09-10 MED ORDER — CARVEDILOL 6.25 MG PO TABS
6.2500 mg | ORAL_TABLET | Freq: Two times a day (BID) | ORAL | 3 refills | Status: DC
  Filled 2023-09-10 (×2): qty 180, 90d supply, fill #0

## 2023-09-10 NOTE — Patient Instructions (Signed)
 Medication Instructions:  DISCONTINUE METOPROLOL  SUCCINATE  START CARVEDILOL  6.25 MG TWICE A DAY; IF AFTER 2 WEEKS SYSTOLIC BLOOD PRESSURE IS PERSISTENTLY GREATER THAN 130, GIVE OFFICE A CALL TO HAVE NEW PRESCRIPTION OF INCREASE CARVEDILOL  12.5 MG TWICE A DAY TO PHARMACY *If you need a refill on your cardiac medications before your next appointment, please call your pharmacy*  Lab Work: NO LABS If you have labs (blood work) drawn today and your tests are completely normal, you will receive your results only by: MyChart Message (if you have MyChart) OR A paper copy in the mail If you have any lab test that is abnormal or we need to change your treatment, we will call you to review the results.  Testing/Procedures:[facility] Your physician has requested that you have an echocardiogram. Echocardiography is a painless test that uses sound waves to create images of your heart. It provides your doctor with information about the size and shape of your heart and how well your heart's chambers and valves are working. This procedure takes approximately one hour. There are no restrictions for this procedure. Please do NOT wear cologne, perfume, aftershave, or lotions (deodorant is allowed). Please arrive 15 minutes prior to your appointment time.  Please note: We ask at that you not bring children with you during ultrasound (echo/ vascular) testing. Due to room size and safety concerns, children are not allowed in the ultrasound rooms during exams. Our front office staff cannot provide observation of children in our lobby area while testing is being conducted. An adult accompanying a patient to their appointment will only be allowed in the ultrasound room at the discretion of the ultrasound technician under special circumstances. We apologize for any inconvenience.   Follow-Up: At [facility], you and your health needs are our priority.  As part of our continuing mission to provide you  with exceptional heart care, our providers are all part of one team.  This team includes your primary Cardiologist (physician) and Advanced Practice Providers or APPs (Physician Assistants and Nurse Practitioners) who all work together to provide you with the care you need, when you need it.  Your next appointment:   FOLLOW UP AS NEEDED  Provider:   Oneil Parchment, MD

## 2023-09-10 NOTE — Progress Notes (Unsigned)
 Cardiology Office Note   Date:  09/12/2023  ID:  Alice Silva, DOB [birth year], MRN [id number] PCP: Gretel Piedra, NP  [facility] Providers Cardiologist:  [facility] - Dr. Jeffrie  History of Present Illness Alice Silva is a 53 y.o. female with past medical history of hypertension, morbid obesity with BMI of 71.66, GERD, and history of lap band.  Patient was recently seen by Dr. Camellia Blush of general surgery service on 07/30/2023 to discuss obstructive symptoms due to Lap-Band.  She is considering switching to a different bariatric procedure such as gastric bypass however patient has shortness of breath on exertion.  She was subsequently referred to cardiology service to rule out heart related causes behind shortness of breath prior to consider surgery and assess cardiovascular risk.  CT of abdomen and pelvis obtained on 09/02/2023 showed small fat-containing umbilical hernia, mild skin thickening in the subcutaneous stranding in the lower anterior wall correlate clinically for cellulitis, cholelithiasis, fibroid uterus.  Patient [facility] new [facility].  She says she moved to the current area in 2021, prior to the move, she was 350 pounds, she has essentially gained 100 pounds since then.  Even when she was at 350 pounds, she was able to walk around without any issue.  Nowadays she no longer walk very much due to significant osteoarthritis of both knees.  She denies any chest pain or palpitation.  She is waiting to get a sleep study done.  I suspect her shortness of breath with activity is due to bilateral knee issue and morbid obesity.  She was prescribed hydrochlorothiazide  however she no longer takes it.  She is on losartan  and metoprolol  succinate.  I will switch her metoprolol  succinate to carvedilol  6.25 mg twice a day for better blood pressure control.  If systolic blood pressure still greater than 130 mmHg after 2 weeks, she can increase to  carvedilol  to 12.5 mg twice a day and give us  a call so we can send in a new prescription.  I recommend echocardiogram, if normal, she can proceed with bariatric surgery from the cardiac perspective.  Suspicion for significant coronary artery disease is very low given lack of any anginal symptoms.  ROS:   She denies chest pain, palpitations, pnd, orthopnea, n, v, dizziness, syncope, edema, weight gain, or early satiety. All other systems reviewed and are otherwise negative except as noted above.   Patient complains of dyspnea on exertion  Studies Reviewed          Risk Assessment/Calculations          Physical Exam VS:  BP (!) 160/90 (BP Location: Right Arm)   Pulse 86   Ht 5' 6 (1.676 m)   Wt (!) 444 lb (201.4 kg)   SpO2 96%   BMI 71.66 kg/m        Wt Readings from Last 3 Encounters:  09/10/23 (!) 444 lb (201.4 kg)  05/20/23 (!) 440 lb (199.6 kg)  05/19/23 (!) 440 lb (199.6 kg)    GEN: Well nourished, well developed in no acute distress NECK: No JVD; No carotid bruits CARDIAC: RRR, no murmurs, rubs, gallops RESPIRATORY:  Clear to auscultation without rales, wheezing or rhonchi  ABDOMEN: Soft, non-tender, non-distended EXTREMITIES:  No edema; No deformity   ASSESSMENT AND PLAN  Preoperative clearance: Dr. Camellia Blush is considering alternative bariatric surgery.  She denies any anginal symptom.  She does complain of shortness of breath with exertion, however this only occurred after she  has gained a 100 pound in weight.  I recommended echocardiogram, if EF is normal, she is cleared to proceed with bariatric surgery  Dyspnea exertion: Likely related to significant weight gain.  She used to weigh 350 pounds prior to moving here.  She is currently 444 pounds.  Will obtain echocardiogram to make sure her ejection fraction is normal  Hypertension: Blood pressure elevated today, will switch metoprolol  to carvedilol  for better blood pressure control.  She is aware that she can  increase carvedilol  to 12.5 mg twice a day after 2 weeks if systolic blood pressure is still greater than 130 mmHg.  Morbid obesity: Considering bariatric surgery.       Dispo: Follow-up as needed if echocardiogram is normal.  Signed, Scot Ford, PA

## 2023-09-12 ENCOUNTER — Other Ambulatory Visit: Payer: Self-pay | Admitting: Obstetrics and Gynecology

## 2023-09-12 DIAGNOSIS — R928 Other abnormal and inconclusive findings on diagnostic imaging of breast: Secondary | ICD-10-CM

## 2023-09-16 DIAGNOSIS — Z0001 Encounter for general adult medical examination with abnormal findings: Secondary | ICD-10-CM | POA: Diagnosis not present

## 2023-09-16 DIAGNOSIS — E559 Vitamin D deficiency, unspecified: Secondary | ICD-10-CM | POA: Diagnosis not present

## 2023-09-16 DIAGNOSIS — E7800 Pure hypercholesterolemia, unspecified: Secondary | ICD-10-CM | POA: Diagnosis not present

## 2023-09-16 DIAGNOSIS — I10 Essential (primary) hypertension: Secondary | ICD-10-CM | POA: Diagnosis not present

## 2023-09-16 DIAGNOSIS — Z13220 Encounter for screening for lipoid disorders: Secondary | ICD-10-CM | POA: Diagnosis not present

## 2023-09-17 ENCOUNTER — Ambulatory Visit (HOSPITAL_COMMUNITY): Admitting: Licensed Clinical Social Worker

## 2023-09-17 DIAGNOSIS — F4320 Adjustment disorder, unspecified: Secondary | ICD-10-CM

## 2023-09-17 DIAGNOSIS — I10 Essential (primary) hypertension: Secondary | ICD-10-CM
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
## 2023-09-19 ENCOUNTER — Other Ambulatory Visit (HOSPITAL_BASED_OUTPATIENT_CLINIC_OR_DEPARTMENT_OTHER): Payer: Self-pay

## 2023-09-19 ENCOUNTER — Other Ambulatory Visit (HOSPITAL_COMMUNITY): Payer: Self-pay

## 2023-09-19 MED ORDER — CARVEDILOL 12.5 MG PO TABS
12.5000 mg | ORAL_TABLET | Freq: Two times a day (BID) | ORAL | 3 refills | Status: DC
  Filled 2023-09-19: qty 180, 90d supply, fill #0

## 2023-09-19 MED ORDER — OLMESARTAN MEDOXOMIL 40 MG PO TABS
40.0000 mg | ORAL_TABLET | Freq: Every day | ORAL | 3 refills | Status: AC
  Filled 2023-09-19 (×2): qty 90, 90d supply, fill #0
  Filled 2023-09-20: qty 30, 30d supply, fill #0
  Filled 2023-10-17: qty 30, 30d supply, fill #1
  Filled 2023-11-15: qty 30, 30d supply, fill #2
  Filled 2023-12-16: qty 30, 30d supply, fill #3
  Filled 2024-01-19: qty 30, 30d supply, fill #4
  Filled 2024-02-15: qty 30, 30d supply, fill #5

## 2023-09-19 NOTE — Telephone Encounter (Signed)
 Let's increase coreg  to 12.5mg  BID, also switch losartan  to a stronger medication, olmesartan  40mg  daily. Schedule appt with hypertension clinic.

## 2023-09-20 ENCOUNTER — Other Ambulatory Visit (HOSPITAL_COMMUNITY): Payer: Self-pay

## 2023-09-20 ENCOUNTER — Encounter (HOSPITAL_COMMUNITY): Payer: Self-pay

## 2023-09-24 ENCOUNTER — Other Ambulatory Visit (HOSPITAL_COMMUNITY): Payer: Self-pay

## 2023-09-24 ENCOUNTER — Encounter: Attending: General Surgery | Admitting: Skilled Nursing Facility1

## 2023-09-24 ENCOUNTER — Encounter: Payer: Self-pay | Admitting: Skilled Nursing Facility1

## 2023-09-24 NOTE — Progress Notes (Signed)
 Nutrition Assessment for Bariatric Surgery: Pre-Surgery Behavioral and Nutrition Intervention Program   Medical Nutrition Therapy  Appt Start Time: 1:55    End Time: 3:00  Patient was seen on 09/24/2023 for Pre-Operative Nutrition Assessment. Purpose of todays visit  enhance perioperative outcomes along with a healthy weight maintenance   Referral stated Supervised Weight Loss (SWL) visits needed: 0  Pt completed visits.   Pt has cleared nutrition requirements.   Pt has completed visits. No further supervised visits required/recommended.  Planned surgery: Sleeve from band Pt expectation of surgery: to lose weight  Pt expectation of dietitian: none stated    NUTRITION ASSESSMENT   Anthropometrics  Start weight at [facility]: 449 lbs (date: 09/24/2023)  Height: 66 in BMI: 72.47 kg/m2     Clinical   Pharmacotherapy: History of weight loss medication used:  contrave  (states it did not work), wegovy  (insurance coverage issues)  Medical hx: HTN, arthritis  Medications: see list; supple,ment: garlic, fish oil, vitamin D Labs: vitamin D 33.8, cholesterol 201 Notable signs/symptoms: headaches happening often stating she currently has one, foods feeling like they get stuck, states she has a bad back and degenerate issues in hips and osteoarthritis in knees and spurs in her feet Any previous deficiencies? No  Evaluation of Nutritional Deficiencies: Micronutrient Nutrition Focused Physical Exam: Hair: No issues observed Eyes: No issues observed Mouth: No issues observed Neck: No issues observed Nails: No issues observed Skin: No issues observed  Lifestyle & Dietary Hx  Pt states since getting the fluid out of the band she has had no reflux but still feels as thought things get stuck.   Pt states her knee pain is the main reason she is not as active as she would like to be. Pt states she misses working out.  Pt states she does not eat all day stating she does not have an appetite  stating her metabolism is shot.  Pt states she lives alone.  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  Pt states she tries to meal prep 2 meals in a day. Pt states she eats nothing in between her 2 meals no fruits no snacks nothing.  Pt states she does not eat breakfast foods.  Pt states she fasts stating she purposely does not eat for 16 hour period to help with her eating habits.   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; goes to sleep around 8:30-9am (so up for about an hour before going to bed) wakes around 3:30-4pm broken sleep patterns not sleep 6 hours continuously   Current Patient Perceived Stress Level as stated by pt on a scale of 1-10: 10 stating her blood pressure is really worrying and stating her job is really stressing her stating she thinks the stress is rising her BP even more Stress Management Techniques: sleep  According to the Dietary Guidelines for Americans Recommendation: equivalent 1.5-2 cups fruits per day, equivalent 2-3 cups vegetables per day and at least half all grains whole  Fruit servings per day (on average): 1-2, meeting 50% recommendation  Non-starchy vegetable servings per day (on average): 1-2, meeting 50% recommendation  Whole Grains per day (on average): 0  Number of meals missed/skipped per week out of 21: 60%  24-Hr Dietary Recall:  1-2 meals First Meal 8:30-9pm: green beans and potatoes and smoked [place] + watermelon or cherries or skipped Snack:  Second Meal: skipped Snack:  Third Meal 4am: green beans and potatoes and smoked [place] or oven baked lemon pepper wings + broccoli and cheese  Snack:  Beverages: 90oz-gallon of water,  diet soda  Alcoholic beverages per week: 0   Estimated Energy Needs Calories: 1500-1800   NUTRITION DIAGNOSIS  Overweight/obesity ([place]-3.3) related to past poor dietary habits and physical inactivity as evidenced by patient w/ planned sleeve from lapband surgery following dietary guidelines for continued weight loss.    NUTRITION INTERVENTION  Nutrition counseling (C-1) and education (E-2) to facilitate bariatric surgery goals.  Educated pt on micronutrient deficiencies post-surgery and behavioral/dietary strategies to start in order to mitigate that risk   Behavioral and Dietary Interventions Pre-Op Goals Reviewed with the Patient Nutrition: Healthy Eating Behaviors Switch to non-caloric, non-carbonated and non-caffeinated beverages such as  water, unsweetened tea, Crystal Light and zero calorie beverages (aim for 64 oz. per day) Cut out grazing between meals or at night  Find a protein shake you like Eat every 3-5 hours        Eliminate distractions while eating (TV, computer, reading, driving, texting) Take 79-69 minutes to eat a meal  Decrease high sugar foods/decrease high fat/fried foods Eliminate alcoholic beverages Increase protein intake (eggs, fish, chicken, yogurt) before surgery Eat non starchy vegetables 2 times a day 7 days a week Eat complex carbohydrates such as whole grains and fruits   Behavioral Modification: Physical Activity Increase my usual daily activity (use stairs, park farther, etc.) Engage in _______________________  activity  _______ minutes ______ times per week  Other:    _________________________________________________________________     Problem Solving I will think about my usual eating patterns and how to tweak them How can my friends and family support me Barriers to starting my changes Learn and understand appetite verses hunger   Healthy Coping Allow for ___________ activities per week to help me manage stress Reframe negative  thoughts I will keep a picture of someone or something that is my inspiration & look at it daily   Monitoring  Weigh myself once a week  Measure my progress by monitoring how my clothes fit Keep a food record of what I eat and drink for the next ________ (time period) Take pictures of what I eat and drink for the next ________ (time period) Use an app to count steps/day for the next_______ (time period) Measure my progress such as increased energy and more restful sleep Monitor your acid reflux and bowel habits, are they getting better?   *Goals that are bolded indicate the pt would like to start working towards these  Handouts Provided Include  Bariatric Surgery handouts (Nutrition Visits, Pre Surgery Behavioral Change Goals, Protein Shakes Brands to Choose From, Vitamins & Mineral Supplementation)  Learning Style & Readiness for Change Teaching method utilized: Visual, Auditory, and hands on  Demonstrated degree of understanding via: Teach Back  Readiness Level: pre-contemplative  Barriers to learning/adherence to lifestyle change: unidentified       MONITORING & EVALUATION Dietary intake, weekly physical activity, body weight, and preoperative behavioral change goals   Next Steps  Patient is to follow up at [facility] for pre-op class

## 2023-09-26 NOTE — Telephone Encounter (Signed)
 It is unusual that Alice Silva blood pressure remain high despite multiple medication adjustment, we can try to go back from coreg  to metoprolol  succinate 100mg  daily and see if her blood improve. Her SBP was 160 when she saw me, however now it is even higher. If her blood pressure does improve after going back to metoprolol  succinate but remain high, we can consider add additional 50mg  at night in the future.

## 2023-09-27 ENCOUNTER — Other Ambulatory Visit: Payer: Self-pay

## 2023-09-27 ENCOUNTER — Other Ambulatory Visit (HOSPITAL_COMMUNITY): Payer: Self-pay

## 2023-09-27 MED ORDER — CYCLOBENZAPRINE HCL 10 MG PO TABS
10.0000 mg | ORAL_TABLET | Freq: Three times a day (TID) | ORAL | 0 refills | Status: AC | PRN
  Filled 2023-09-27: qty 30, 10d supply, fill #0

## 2023-09-30 ENCOUNTER — Other Ambulatory Visit (HOSPITAL_COMMUNITY): Payer: Self-pay

## 2023-09-30 ENCOUNTER — Ambulatory Visit: Admitting: Dietician

## 2023-09-30 ENCOUNTER — Institutional Professional Consult (permissible substitution): Admitting: Neurology

## 2023-09-30 ENCOUNTER — Other Ambulatory Visit: Payer: Self-pay | Admitting: Physician Assistant

## 2023-09-30 ENCOUNTER — Encounter (HOSPITAL_COMMUNITY): Payer: Self-pay

## 2023-09-30 MED ORDER — METOPROLOL SUCCINATE ER 50 MG PO TB24
50.0000 mg | ORAL_TABLET | Freq: Every evening | ORAL | 0 refills | Status: DC
  Filled 2023-09-30 – 2023-10-01 (×2): qty 30, 30d supply, fill #0

## 2023-09-30 MED ORDER — METOPROLOL SUCCINATE ER 100 MG PO TB24: 100.0000 mg | ORAL_TABLET | Freq: Every morning | ORAL | Status: DC

## 2023-09-30 NOTE — Telephone Encounter (Signed)
 Spoke with the patient over the phone, she will go back to 100mg  metoprolol  succinate every morning from coreg  as coreg  did not work very well for her. If SBP after 3 days is still > , I asked her to add a 50mg  metoprolol  succinate at night. The night time dose of metoprolol  sent to pharmacy. She still has some of the 100mg  metoprolol  succinate to take during the day.

## 2023-10-01 ENCOUNTER — Other Ambulatory Visit: Payer: Self-pay

## 2023-10-01 ENCOUNTER — Other Ambulatory Visit (HOSPITAL_COMMUNITY): Payer: Self-pay

## 2023-10-03 ENCOUNTER — Other Ambulatory Visit (HOSPITAL_COMMUNITY): Payer: Self-pay

## 2023-10-07 ENCOUNTER — Ambulatory Visit
Admission: RE | Admit: 2023-10-07 | Discharge: 2023-10-07 | Disposition: A | Discharge status: Home / Self Care | Source: Ambulatory Visit | Attending: Obstetrics and Gynecology

## 2023-10-07 ENCOUNTER — Other Ambulatory Visit: Payer: Self-pay | Admitting: Obstetrics and Gynecology

## 2023-10-07 DIAGNOSIS — R928 Other abnormal and inconclusive findings on diagnostic imaging of breast: Secondary | ICD-10-CM

## 2023-10-07 DIAGNOSIS — N6320 Unspecified lump in the left breast, unspecified quadrant: Secondary | ICD-10-CM

## 2023-10-07 DIAGNOSIS — N6325 Unspecified lump in the left breast, overlapping quadrants: Secondary | ICD-10-CM | POA: Diagnosis not present

## 2023-10-10 ENCOUNTER — Encounter: Payer: Self-pay | Admitting: *Deleted

## 2023-10-14 ENCOUNTER — Encounter: Payer: Self-pay | Admitting: Neurology

## 2023-10-14 ENCOUNTER — Ambulatory Visit (INDEPENDENT_AMBULATORY_CARE_PROVIDER_SITE_OTHER): Admitting: Neurology

## 2023-10-14 ENCOUNTER — Ambulatory Visit (HOSPITAL_BASED_OUTPATIENT_CLINIC_OR_DEPARTMENT_OTHER)
Admission: RE | Admit: 2023-10-14 | Discharge: 2023-10-14 | Disposition: A | Discharge status: Home / Self Care | Source: Ambulatory Visit | Attending: Physician Assistant | Admitting: Physician Assistant

## 2023-10-14 VITALS — BP 138/93 | HR 99 | Ht 66.0 in | Wt >= 6400 oz

## 2023-10-14 DIAGNOSIS — Z6841 Body Mass Index (BMI) 40.0 and over, adult: Secondary | ICD-10-CM | POA: Diagnosis not present

## 2023-10-14 DIAGNOSIS — R0602 Shortness of breath: Secondary | ICD-10-CM | POA: Insufficient documentation

## 2023-10-14 DIAGNOSIS — Z9189 Other specified personal risk factors, not elsewhere classified: Secondary | ICD-10-CM

## 2023-10-14 DIAGNOSIS — R635 Abnormal weight gain: Secondary | ICD-10-CM | POA: Diagnosis not present

## 2023-10-14 DIAGNOSIS — R0683 Snoring: Secondary | ICD-10-CM | POA: Diagnosis not present

## 2023-10-14 DIAGNOSIS — R351 Nocturia: Secondary | ICD-10-CM | POA: Diagnosis not present

## 2023-10-14 DIAGNOSIS — R519 Headache, unspecified: Secondary | ICD-10-CM

## 2023-10-14 DIAGNOSIS — Z9884 Bariatric surgery status: Secondary | ICD-10-CM | POA: Diagnosis not present

## 2023-10-14 LAB — ECHOCARDIOGRAM COMPLETE
AR max vel: 2.52 cm2
AV Area VTI: 2.92 cm2
AV Area mean vel: 2.64 cm2
AV Mean grad: 6.5 mmHg
AV Peak grad: 12.5 mmHg
Ao pk vel: 1.77 m/s
Area-P 1/2: 5.62 cm2
Calc EF: 62.2 %
S' Lateral: 2.2 cm
Single Plane A2C EF: 59.7 %
Single Plane A4C EF: 64.5 %

## 2023-10-14 NOTE — Progress Notes (Signed)
 Subjective:    Patient ID: Alice Silva is a 53 y.o. female.  HPI    True Mar, MD, PhD [facility] [address], [address] [address] [place], [place] [address]  Dear Dr. Tanda,   I saw your patient, Alice Silva, upon your kind request in my sleep clinic today for initial consultation of her sleep disorder, in particular, concern for underlying obstructive sleep apnea.  The patient is unaccompanied today.  As you know, Alice Silva is a 53 year old female with an underlying medical history of reflux disease, arthritis, hypertension, status post gastric banding, and morbid obesity with a BMI of over 70, who reports snoring and some tiredness but difficulty sleeping because of shiftwork primarily.  She is being evaluated for bariatric surgery.  Her Epworth sleepiness score is 6 out of 24, fatigue severity score is 45 out of 63.  She works third shift as an Insurance underwriter.  I reviewed your office note from 07/30/2023.  She had reversal of her gastric banding in 2021.  She had Lap-Band surgery in 2012.  She gained most of her weight back and probably additional weight since the band was drained.  She is not aware of any family history of sleep apnea.  She is single and lives alone, she has 1 grown daughter.  Her snoring has been apparent in the past 6 months.  She is a non-smoker and does not drink daily caffeine or alcohol .  Bedtime is generally around 9 AM and she tries to sleep till 5 but often does not sleep that long.  She works from 7 PM to 7 AM.  She does get up to go to the bathroom between 1 and 3 times after going to bed.  She has had headaches upon awakening.  Her Past Medical History Is Significant For: Past Medical History:  Diagnosis Date   Arthritis    GERD with esophagitis    History of gallstones    Hypertension    Left breast mass    Osteoarthritis    Perimenopause    Prolactinoma (HCC)     Her Past Surgical History Is Significant For: Past Surgical  History:  Procedure Laterality Date   CERVICAL BIOPSY  W/ LOOP ELECTRODE EXCISION  2019   LAPAROSCOPIC GASTRIC BANDING  2012   TONSILLECTOMY AND ADENOIDECTOMY     age 43   TUBAL LIGATION      Her Family History Is Significant For: Family History  Problem Relation Age of Onset   Hypertension Mother    High blood pressure Mother    Hyperlipidemia Mother    Depression Mother    Obesity Mother    Hypertension Father    Hyperlipidemia Father    High blood pressure Father    Obesity Father    Alcohol  abuse Sister        cirrhosis   Cancer Maternal Grandmother 15       pancreatic   Pancreatic cancer Maternal Grandmother    Stroke Paternal Grandmother    Heart disease Paternal Grandmother 45       MI   Dementia Maternal Uncle 74   Cancer Paternal Aunt 75       Breast   Asthma Daughter    Sleep apnea Neg Hx     Her Social History Is Significant For: Social History   Socioeconomic History   Marital status: Single    Spouse name: Not on file   Number of children: 1   Years of education:  Not on file   Highest education level: Bachelor's degree (e.g., BA, AB, BS)  Occupational History    Comment: RN- ICU  Tobacco Use   Smoking status: Never   Smokeless tobacco: Never  Vaping Use   Vaping status: Never Used  Substance and Sexual Activity   Alcohol  use: Never    Comment: social   Drug use: Never   Sexual activity: Yes    Birth control/protection: Surgical    Comment: tubal ligation  Other Topics Concern   Not on file  Social History Narrative   Single, Boyfriend   daughter 52, garan son 7   Exercise: inconsistent    Coffee, tea 1-2x/week   Social Drivers of Corporate investment banker Strain: Low Risk  (08/28/2021)   [facility] prior to [facility]21[facility]2022[facility]   Overall Financial Resource Strain (CARDIA)    Difficulty of Paying Living Expenses: Not hard at all  Food Insecurity: No Food Insecurity (11/30/2021)    Received from [facility] visits prior to [facility]/21/2022., [facility]   Hunger Vital Sign    Within the past 12 months, you worried that your food would run out before you got the money to buy more.: Never true    Within the past 12 months, the food you bought just didn't last and you didn't have money to get more.: Never true  Transportation Needs: No Transportation Needs (11/30/2021)   [facility] prior to [facility]21/20[facility]   PRAPARE - Transportation    Lack of Transportation (Medical): No    Lack of Transportation (Non-Medical): No  Physical Activity: Inactive (08/28/2021)   Received [facility] visits prior to [facility]21/2022[facility]   Exercise Vital Sign    On average, how many days per week do you engage in moderate to strenuous exercise (like a brisk walk)?: 0 days    On average, how many minutes do you engage in exercise at this level?: 0 min  Stress: No Stress Concern Present (08/28/2021)   Received from [facility] prior to [facility]21/2022[facility]   [employer] of Occupational Health - Occupational Stress Questionnaire    Feeling of Stress : Not at all  Social Connections: Socially Isolated (08/28/2021)   [facility] from [facility] visits prior to [facility]21/2022[facility]   Social Connection and Isolation Panel    In a typical week, how many times do you talk on the phone with family, friends, or neighbors?: More than three times a week    How often do you get together with friends or relatives?: More than three times a week    How often do you attend church or religious services?: Never    Do you belong to any clubs or organizations such as church groups, unions, fraternal or athletic groups, or school groups?: No    How often do you attend meetings of the clubs or organizations you belong to?: Never    Are you married, widowed,  divorced, separated, never married, or living with a partner?: Never married    Her Allergies Are:  Allergies  Allergen Reactions   Ace Inhibitors Cough    Note: consistent cough   :   Her Current Medications Are:  Outpatient Encounter Medications as of 10/14/2023  Medication Sig   Aspirin-Acetaminophen-Caffeine (EXCEDRIN PO) Take by mouth as needed. generic   clobetasol   ointment (TEMOVATE ) 0.05 % Apply 1 Application topically 2 (two) times daily. (Patient taking differently: Apply 1 Application topically 2 (two) times daily. As needed)   cyclobenzaprine  (FLEXERIL ) 10 MG tablet Take 1 tablet (10 mg total) by mouth 3 (three) times daily as needed.   FIBER PO Take by mouth.   fluticasone (FLONASE) 50 MCG/ACT nasal spray Place 2 sprays into the nose as needed for allergies.   Garlic 1000 MG CAPS  (Patient taking differently: 5,000 mg daily.)   ibuprofen (ADVIL) 200 MG tablet Take 400-600 mg by mouth as needed.   loratadine (CLARITIN) 10 MG tablet Take 10 mg by mouth as needed for allergies.   metoprolol  succinate (TOPROL  XL) 100 MG 24 hr tablet Take 1 tablet (100 mg total) by mouth every morning. Take with or immediately following a meal.   metoprolol  succinate (TOPROL  XL) 50 MG 24 hr tablet Take 1 tablet (50 mg total) by mouth at bedtime. Take with or immediately following a meal. (50mg  daily at night, while keeping 100mg  in AM)   Multiple Vitamin (MULTIVITAMIN ADULT PO)    olmesartan  (BENICAR ) 40 MG tablet Take 1 tablet (40 mg total) by mouth daily.   Omega-3 1000 MG CAPS Take by mouth daily at 6 (six) AM.   Probiotic Product (PROBIOTIC PEARLS) CAPS    Vitamin D-Vitamin K (D3 + K2 PO) Take by mouth. D3 10,000 int'l units, K2 200 mcg daily   cyclobenzaprine  (FLEXERIL ) 10 MG tablet Take 1 tablet (10 mg total) by mouth daily as needed for Muscle spasms.   hydrochlorothiazide  (HYDRODIURIL ) 25 MG tablet  (Patient not taking: Reported on 10/14/2023)   losartan  (COZAAR ) 100 MG tablet Take 100 mg  by mouth daily. (Patient not taking: Reported on 10/14/2023)   Nutritional Supplements (VITAMIN D MAINTENANCE PO)    [DISCONTINUED] buPROPion  (WELLBUTRIN  XL) 150 MG 24 hr tablet Take 1 tablet by mouth daily.   [DISCONTINUED] ibuprofen (ADVIL) 800 MG tablet as needed for moderate pain (pain score 4-6).   No facility-administered encounter medications on file as of 10/14/2023.  :   Review of Systems:  Out of a complete 14 point review of systems, all are reviewed and negative with the exception of these symptoms as listed below:  Review of Systems  Neurological:        Patient is here alone for sleep consult for bariatric surgery work-up. She states she works 3rd shift and her sleep is all over. She states she snores and has not had any witnessed apneic events. Patient has never had a sleep study. She has no family history of sleep apnea. ESS 6 FSS 45    Objective:  Neurological Exam  Physical Exam Physical Examination:   Vitals:   10/14/23 1413  BP: (!) 138/93  Pulse: 99    General Examination: The patient is a very pleasant 53 y.o. female in no acute distress. She appears well-developed and well-nourished and well groomed.   HEENT: Normocephalic, atraumatic, pupils are equal, round and reactive to light, extraocular tracking is good without limitation to gaze excursion or nystagmus noted. Hearing is grossly intact. Face is symmetric with normal facial animation. Speech is clear with no dysarthria noted. There is no hypophonia. There is no lip, neck/head, jaw or voice tremor. Neck is supple with full range of passive and active motion. There are no carotid bruits on auscultation. Oropharynx exam reveals: mild mouth dryness, good dental hygiene and moderate airway crowding, due to small airway, status post tonsillectomy, uvula not fully  visualized, Mallampati class III.  Neck circumference 18 5/8 inches. Tongue protrudes centrally and palate elevates symmetrically.    Chest: Clear to  auscultation without wheezing, rhonchi or crackles noted.  Heart: S1+S2+0, regular and normal without murmurs, rubs or gallops noted.   Abdomen: Soft, non-tender and non-distended.  Extremities: There is trace pitting edema in the distal lower extremities bilaterally.   Skin: Warm and dry without trophic changes noted.   Musculoskeletal: exam reveals no obvious joint deformities.   Neurologically:  Mental status: The patient is awake, alert and oriented in all 4 spheres. Her immediate and remote memory, attention, language skills and fund of knowledge are appropriate. There is no evidence of aphasia, agnosia, apraxia or anomia. Speech is clear with normal prosody and enunciation. Thought process is linear. Mood is normal and affect is normal.  Cranial nerves II - XII are as described above under HEENT exam.  Motor exam: Normal bulk, strength and tone is noted. There is no obvious action or resting tremor.  Fine motor skills and coordination: grossly intact.  Cerebellar testing: No dysmetria or intention tremor. There is no truncal or gait ataxia.  Sensory exam: intact to light touch in the upper and lower extremities.  Gait, station and balance: She stands without difficulty. No problems walking or turning.     Assessment and Plan:  In summary, Alice Silva is a very pleasant 53 y.o.-year old female with an underlying medical history of reflux disease, arthritis, hypertension, status post gastric banding, and morbid obesity with a BMI of over 70, whose history and physical exam are concerning for sleep disordered breathing, particularly obstructive sleep apnea (OSA). A laboratory attended sleep study is typically considered gold standard for evaluation of sleep disordered breathing.   I had a long chat with the patient about my findings and the diagnosis of sleep apnea, particularly OSA, its prognosis and treatment options. We talked about medical/conservative treatments, surgical  interventions and non-pharmacological approaches for symptom control. I explained, in particular, the risks and ramifications of untreated moderate to severe OSA, especially with respect to developing cardiovascular disease down the road, including congestive heart failure (CHF), difficult to treat hypertension, cardiac arrhythmias (particularly A-fib), neurovascular complications including TIA, stroke and dementia. Even type 2 diabetes has, in part, been linked to untreated OSA. Symptoms of untreated OSA may include (but may not be limited to) daytime sleepiness, nocturia (i.e. frequent nighttime urination), memory problems, mood irritability and suboptimally controlled or worsening mood disorder such as depression and/or anxiety, lack of energy, lack of motivation, physical discomfort, as well as recurrent headaches, especially morning or nocturnal headaches. We talked about the importance of maintaining a healthy lifestyle and striving for healthy weight.  In addition, we talked about the importance of striving for and maintaining good sleep hygiene. I recommended a sleep study at this time. I outlined the differences between a laboratory attended sleep study which is considered more comprehensive and accurate over the option of a home sleep test (HST); the latter may lead to underestimation of sleep disordered breathing in some instances and does not help with diagnosing upper airway resistance syndrome and is not accurate enough to diagnose primary central sleep apnea typically. I outlined possible surgical and non-surgical treatment options of OSA, including the use of a positive airway pressure (PAP) device (i.e. CPAP, AutoPAP/APAP or BiPAP in certain circumstances), a custom-made dental device (aka oral appliance, which would require a referral to a specialist dentist or orthodontist typically, and is generally speaking not considered  for patients with full dentures or edentulous state), upper airway  surgical options, such as traditional UPPP (which is not considered a first-line treatment) or the Inspire device (hypoglossal nerve stimulator, which would involve a referral for consultation with an ENT surgeon, after careful selection, following inclusion criteria - also not first-line treatment). I explained the PAP treatment option to the patient in detail, as this is generally considered first-line treatment.  The patient indicated that she would be willing to try PAP therapy, if the need arises. I explained the importance of being compliant with PAP treatment, not only for insurance purposes but primarily to improve patient's symptoms symptoms, and for the patient's long term health benefit, including to reduce Her cardiovascular risks longer-term.    We will pick up our discussion about the next steps and treatment options after testing.  We will keep her posted as to the test results by phone call and/or MyChart messaging where possible.  We will plan to follow-up in sleep clinic accordingly as well.  I answered all her questions today and the patient was in agreement.   I encouraged her to call with any interim questions, concerns, problems or updates or email us  through MyChart.  Generally speaking, sleep test authorizations may take up to 2 weeks, sometimes less, sometimes longer, the patient is encouraged to get in touch with us  if they do not hear back from the sleep lab staff directly within the next 2 weeks.  Thank you very much for allowing me to participate in the care of this nice patient. If I can be of any further assistance to you please do not hesitate to call me at [phone].  Sincerely,   True Mar, MD, PhD

## 2023-10-14 NOTE — Patient Instructions (Signed)

## 2023-10-16 ENCOUNTER — Telehealth: Payer: Self-pay

## 2023-10-16 ENCOUNTER — Ambulatory Visit: Admitting: Cardiology

## 2023-10-16 NOTE — Telephone Encounter (Signed)
   Pre-operative Risk Assessment    Patient Name: Alice Silva  DOB: 1970/06/21 MRN: [id number]   Date of last office visit: 09/10/23 Date of next office visit: 11/04/23   Request for Surgical Clearance    Procedure:  Laparoscopic bariatric surgery  Date of Surgery:  Clearance TBD                                Surgeon:  Camellia Blush, MD Surgeon's Group or Practice Name:  [facility] Phone number:  [phone] Fax number:  [phone]   Type of Clearance Requested:   - Medical    Type of Anesthesia:  General    Additional requests/questions:    Bonney Ival LOISE Gerome   10/16/2023, 4:40 PM

## 2023-10-16 NOTE — Telephone Encounter (Signed)
   Name: Alice Silva  DOB: 15-Nov-1970  MRN: [id number]   Primary Cardiologist: Oneil Parchment, MD  Chart reviewed as part of pre-operative protocol coverage. Patient was contacted 10/16/2023 in reference to pre-operative risk assessment for pending surgery as outlined below.  Lucylle Foulkes was last seen on 7/22/5 by Scot Ford PAC.    Due to DOE, an echocardiogram was obtained and demonstrated normal BiV function and no significant valvular disease.   Therefore, based on ACC/AHA guidelines, the patient would be at acceptable risk for the planned procedure without further cardiovascular testing.   I will route this recommendation to the requesting party via Epic fax function and remove from pre-op pool. Please call with questions.  Jon Nat Hails, PA 10/16/2023, 4:58 PM

## 2023-10-17 ENCOUNTER — Ambulatory Visit: Payer: Self-pay | Admitting: Physician Assistant

## 2023-10-17 ENCOUNTER — Other Ambulatory Visit (HOSPITAL_COMMUNITY): Payer: Self-pay

## 2023-10-22 ENCOUNTER — Other Ambulatory Visit (HOSPITAL_COMMUNITY): Payer: Self-pay | Admitting: Diagnostic Radiology

## 2023-10-22 ENCOUNTER — Ambulatory Visit
Admission: RE | Admit: 2023-10-22 | Discharge: 2023-10-22 | Disposition: A | Discharge status: Home / Self Care | Source: Ambulatory Visit | Attending: Obstetrics and Gynecology

## 2023-10-22 ENCOUNTER — Ambulatory Visit
Admission: RE | Admit: 2023-10-22 | Discharge: 2023-10-22 | Disposition: A | Discharge status: Home / Self Care | Source: Ambulatory Visit | Attending: Obstetrics and Gynecology | Admitting: Obstetrics and Gynecology

## 2023-10-22 DIAGNOSIS — N6012 Diffuse cystic mastopathy of left breast: Secondary | ICD-10-CM | POA: Diagnosis not present

## 2023-10-22 DIAGNOSIS — N6320 Unspecified lump in the left breast, unspecified quadrant: Secondary | ICD-10-CM

## 2023-10-22 DIAGNOSIS — N6325 Unspecified lump in the left breast, overlapping quadrants: Secondary | ICD-10-CM | POA: Diagnosis not present

## 2023-10-22 HISTORY — PX: BREAST BIOPSY: SHX20

## 2023-10-23 LAB — SURGICAL PATHOLOGY

## 2023-10-26 ENCOUNTER — Other Ambulatory Visit: Payer: Self-pay | Admitting: Physician Assistant

## 2023-10-29 ENCOUNTER — Other Ambulatory Visit: Payer: Self-pay

## 2023-10-29 ENCOUNTER — Other Ambulatory Visit (HOSPITAL_COMMUNITY): Payer: Self-pay

## 2023-10-29 MED ORDER — METOPROLOL SUCCINATE ER 100 MG PO TB24: 100.0000 mg | ORAL_TABLET | Freq: Every morning | ORAL | Status: DC

## 2023-10-29 MED ORDER — METOPROLOL SUCCINATE ER 50 MG PO TB24
50.0000 mg | ORAL_TABLET | Freq: Every evening | ORAL | 3 refills | Status: AC
  Filled 2023-10-29: qty 90, 90d supply, fill #0
  Filled 2024-01-27: qty 90, 90d supply, fill #1

## 2023-10-29 MED ORDER — METOPROLOL SUCCINATE ER 100 MG PO TB24
100.0000 mg | ORAL_TABLET | Freq: Every morning | ORAL | 3 refills | Status: AC
  Filled 2023-10-29: qty 90, 90d supply, fill #0
  Filled 2024-01-27: qty 90, 90d supply, fill #1

## 2023-10-29 NOTE — Addendum Note (Signed)
 Addended by: GLADIS REENA GAILS on: 10/29/2023 04:39 PM   Modules accepted: Orders

## 2023-11-04 ENCOUNTER — Other Ambulatory Visit (HOSPITAL_COMMUNITY): Payer: Self-pay

## 2023-11-04 ENCOUNTER — Telehealth: Payer: Self-pay | Admitting: Neurology

## 2023-11-04 ENCOUNTER — Telehealth: Admitting: Physician Assistant

## 2023-11-04 ENCOUNTER — Ambulatory Visit: Admitting: Pharmacist

## 2023-11-04 ENCOUNTER — Encounter (HOSPITAL_COMMUNITY): Payer: Self-pay

## 2023-11-04 DIAGNOSIS — L02214 Cutaneous abscess of groin: Secondary | ICD-10-CM | POA: Diagnosis not present

## 2023-11-04 MED ORDER — CEPHALEXIN 500 MG PO CAPS
500.0000 mg | ORAL_CAPSULE | Freq: Four times a day (QID) | ORAL | 0 refills | Status: DC
  Filled 2023-11-04: qty 20, 5d supply, fill #0

## 2023-11-04 NOTE — Telephone Encounter (Signed)
 Sent mychart   10/23/23 LVM KS 10/15/23 [facility] [employer] no auth req EE

## 2023-11-04 NOTE — Progress Notes (Deleted)
 Patient ID: Alice Silva                 DOB: 11-25-1970                      MRN: [id number]      HPI: Casondra Gasca is a 53 y.o. female referred by Scot Ford, PA to HTN clinic. PMH is significant for hypertension, GERD, obesity with BMI of 71.66    Patient was in to see PA on 09/12/2023 Pre-op visit. BP was elevated. Was on losartan  and metoprolol  succinate.  metoprolol  succinate switched to carvedilol  6.25 mg twice a day for better blood pressure control. And pt was advised to increased the dose to 12.5 mg in 2 weeks  if SBP remained above goal. BP remained elevated on coreg  so it was switched back to metoprolol  XL 100 mg QAM and 50 mg QPM and losartan  was changed to olmesartan  40 mg daily  Current HTN meds: carvedilol  12.5 mg twice daily, Olmesartan  40 mg daily,  Previously tried:  BP goal:   Family History:   Social History:   Diet:   Exercise:  {types:28256}  Home BP readings:  Date SBP/DBP  HR              Average      Wt Readings from Last 3 Encounters:  10/14/23 (!) 449 lb (203.7 kg)  09/24/23 (!) 449 lb (203.7 kg)  09/10/23 (!) 444 lb (201.4 kg)   BP Readings from Last 3 Encounters:  10/14/23 (!) 138/93  09/10/23 (!) 160/90  05/20/23 (!) 138/90   Pulse Readings from Last 3 Encounters:  10/14/23 99  09/10/23 86  05/20/23 (!) 116    Renal function: CrCl cannot be calculated (Patient's most recent lab result is older than the maximum 21 days allowed.).  Past Medical History:  Diagnosis Date   Arthritis    GERD with esophagitis    History of gallstones    Hypertension    Left breast mass    Osteoarthritis    Perimenopause    Prolactinoma (HCC)     Current Outpatient Medications on File Prior to Visit  Medication Sig Dispense Refill   Aspirin-Acetaminophen-Caffeine (EXCEDRIN PO) Take by mouth as needed. generic     clobetasol  ointment (TEMOVATE ) 0.05 % Apply 1 Application topically 2 (two) times daily. (Patient taking differently: Apply 1 Application  topically 2 (two) times daily. As needed) 60 g 2   cyclobenzaprine  (FLEXERIL ) 10 MG tablet Take 1 tablet (10 mg total) by mouth daily as needed for Muscle spasms. 30 tablet 0   cyclobenzaprine  (FLEXERIL ) 10 MG tablet Take 1 tablet (10 mg total) by mouth 3 (three) times daily as needed. 30 tablet 0   FIBER PO Take by mouth.     fluticasone (FLONASE) 50 MCG/ACT nasal spray Place 2 sprays into the nose as needed for allergies.     Garlic 1000 MG CAPS  (Patient taking differently: 5,000 mg daily.)     hydrochlorothiazide  (HYDRODIURIL ) 25 MG tablet  (Patient not taking: Reported on 10/14/2023)     ibuprofen (ADVIL) 200 MG tablet Take 400-600 mg by mouth as needed.     loratadine (CLARITIN) 10 MG tablet Take 10 mg by mouth as needed for allergies.     losartan  (COZAAR ) 100 MG tablet Take 100 mg by mouth daily. (Patient not taking: Reported on 10/14/2023)     metoprolol  succinate (TOPROL  XL) 100 MG 24 hr tablet Take 1 tablet (100 mg  total) by mouth every morning. 90 tablet 3   metoprolol  succinate (TOPROL  XL) 50 MG 24 hr tablet Take 1 tablet (50 mg total) by mouth at bedtime. ( This is in addition to taking 100mg  in AM) 90 tablet 3   Multiple Vitamin (MULTIVITAMIN ADULT PO)      Nutritional Supplements (VITAMIN D MAINTENANCE PO)      olmesartan  (BENICAR ) 40 MG tablet Take 1 tablet (40 mg total) by mouth daily. 90 tablet 3   Omega-3 1000 MG CAPS Take by mouth daily at 6 (six) AM.     Probiotic Product (PROBIOTIC PEARLS) CAPS      Vitamin D-Vitamin K (D3 + K2 PO) Take by mouth. D3 10,000 int'l units, K2 200 mcg daily     No current facility-administered medications on file prior to visit.    Allergies  Allergen Reactions   Ace Inhibitors Cough    Note: consistent cough     There were no vitals taken for this visit.   Assessment/Plan:  1. Hypertension -  No problems updated. No problem-specific Assessment & Plan notes found for this encounter.      Thank you  Robbi Blanch,  Pharm.D [facility] Elspeth BIRCH. [facility] [address], [place], [place] [address] Phone: [phone]; Fax: [phone]

## 2023-11-04 NOTE — Progress Notes (Signed)

## 2023-11-08 ENCOUNTER — Ambulatory Visit: Admitting: Neurology

## 2023-11-08 DIAGNOSIS — Z9884 Bariatric surgery status: Secondary | ICD-10-CM

## 2023-11-08 DIAGNOSIS — R519 Headache, unspecified: Secondary | ICD-10-CM

## 2023-11-08 DIAGNOSIS — G4733 Obstructive sleep apnea (adult) (pediatric): Secondary | ICD-10-CM | POA: Diagnosis not present

## 2023-11-08 DIAGNOSIS — R0683 Snoring: Secondary | ICD-10-CM

## 2023-11-08 DIAGNOSIS — G4734 Idiopathic sleep related nonobstructive alveolar hypoventilation: Secondary | ICD-10-CM

## 2023-11-08 DIAGNOSIS — Z9189 Other specified personal risk factors, not elsewhere classified: Secondary | ICD-10-CM

## 2023-11-08 DIAGNOSIS — R351 Nocturia: Secondary | ICD-10-CM

## 2023-11-08 DIAGNOSIS — R635 Abnormal weight gain: Secondary | ICD-10-CM

## 2023-11-12 ENCOUNTER — Other Ambulatory Visit: Payer: Self-pay

## 2023-11-12 DIAGNOSIS — M170 Bilateral primary osteoarthritis of knee: Secondary | ICD-10-CM

## 2023-11-15 ENCOUNTER — Other Ambulatory Visit (HOSPITAL_COMMUNITY): Payer: Self-pay

## 2023-11-19 NOTE — Progress Notes (Unsigned)
 Alice Silva

## 2023-11-20 ENCOUNTER — Ambulatory Visit: Payer: Self-pay | Admitting: Neurology

## 2023-11-20 ENCOUNTER — Encounter: Payer: Self-pay | Admitting: Neurology

## 2023-11-20 DIAGNOSIS — G4733 Obstructive sleep apnea (adult) (pediatric): Secondary | ICD-10-CM

## 2023-11-20 DIAGNOSIS — G4734 Idiopathic sleep related nonobstructive alveolar hypoventilation: Secondary | ICD-10-CM

## 2023-11-20 NOTE — Procedures (Signed)
 [facility]  HOME SLEEP TEST (SANSA) REPORT (Mail-Out Device):   STUDY DATE: 11/10/23  DOB: 1970-08-10  MRN: [id number]  ORDERING CLINICIAN: True Mar, MD, PhD   REFERRING CLINICIAN: Dr. Camellia Blush  CLINICAL INFORMATION/HISTORY (obtained from visit note dated 10/14/23): 53 year old female with an underlying medical history of reflux disease, arthritis, hypertension, status post gastric banding, and morbid obesity with a BMI of over 70, who reports snoring and some tiredness but difficulty sleeping because of shiftwork primarily.  She is being evaluated for bariatric surgery.   PATIENT'S LAST REPORTED EPWORTH SLEEPINESS SCORE (ESS): 6/24.  BMI (at the time of sleep clinic visit and/or test date): 72.5 kg/m  FINDINGS:   Study Protocol:    The SANSA single-point-of-skin-contact chest-worn sensor - an FDA cleared and DOT approved type 4 home sleep test device - measures eight physiological channels,  including blood oxygen saturation (measured via PPG [photoplethysmography]), EKG-derived heart rate, respiratory effort, chest movement (measured via accelerometer), snoring, body position, and actigraphy. The device is designed to be worn for up to 10 hours per study.   Sleep Summary:   Total Recording Time (hours, min): 9 hours, 26 min  Total Effective Sleep Time (hours, min):  8 hours, 6 min  Sleep Efficiency (%):    86%   Respiratory Indices:   Calculated sAHI (per hour):  32.3/hour         Oxygen Saturation Statistics:    Oxygen Saturation (%) Mean: 95.3%   Minimum oxygen saturation (%):                 73.7%   O2 Saturation Range (%): 73.7 - 100%   Time below or at 88% saturation: 23 min   Pulse Rate Statistics:   Pulse Mean (bpm):    82/min    Pulse Range (67 - 115/min)   Snoring: mild to loud   IMPRESSION/DIAGNOSES:   OSA (obstructive sleep apnea), severe  Nocturnal Hypoxemia  RECOMMENDATIONS:   This home sleep test demonstrates  severe obstructive sleep apnea with a total AHI of 32.3/hour and O2 nadir of 73.7% with significant time below or at 88% saturation of over 20 minutes for the study, indicating nocturnal hypoxemia. Snoring was detected, in the mild to louder range.  Treatment with positive airway pressure is highly recommended. The patient will be advised to proceed with an autoPAP titration/trial at home. A laboratory attended titration study can be considered in the future for optimization of treatment settings and to improve tolerance and compliance, if needed, down the road. Alternative treatment options are limited secondary to the severity of the patient's sleep disordered breathing, but may include surgical treatment with an implantable hypoglossal nerve stimulator (in carefully selected candidates, meeting criteria).  Concomitant weight loss is recommended (where clinically appropriate). Please note, that untreated obstructive sleep apnea may carry additional perioperative morbidity. Patients with significant obstructive sleep apnea should receive perioperative PAP therapy and the surgeons and particularly the anesthesiologist should be informed of the diagnosis and the severity of the sleep disordered breathing. The patient should be cautioned not to drive, work at heights, or operate dangerous or heavy equipment when tired or sleepy. Review and reiteration of good sleep hygiene measures should be pursued with any patient. Other causes of the patient's symptoms, including circadian rhythm disturbances, an underlying mood disorder, medication effect and/or an underlying medical problem cannot be ruled out based on this test. Clinical correlation is recommended.  The patient and her referring provider will be notified of  the test results. The patient will be seen in follow up in sleep clinic at [facility].  I certify that I have reviewed the raw data recording prior to the issuance of this report in accordance with the  standards of the American Academy of Sleep Medicine (AASM).    INTERPRETING PHYSICIAN:   True Mar, MD, PhD Medical Director, [facility]) Diplomat, ABPN (Neurology and Sleep)   [facility] [address] [place], [place] [address] [phone]

## 2023-11-21 NOTE — Telephone Encounter (Signed)
LMVM for pt to return call for sleep study results.  

## 2023-11-21 NOTE — Telephone Encounter (Signed)
-----   Message from True Mar sent at 11/20/2023  6:36 PM EDT ----- Urgent set up requested on PAP therapy, due to severe OSA. Patient referred by Dr. Camellia Blush, seen by me on 10/14/23, patient had a HST on 11/10/23.    Please call and notify the patient that the recent home sleep test showed obstructive sleep apnea in the severe range. I recommend treatment for this in the form of autoPAP, which means, that we  don't have to bring her in for a sleep study with CPAP, but will let her start using a so called autoPAP machine at home, through a DME company (of her choice, or as per insurance requirement). The  DME representative will fit the patient with a mask of choice, educate her on how to use the machine, how to put the mask on, etc. I have placed an order in the chart. Please send the order to a  local DME, talk to patient, send report to referring MD. Please also reinforce the need for compliance with treatment. We will need a FU in sleep clinic for 10 weeks post-PAP set up, please arrange  that with me or one of our NPs. Thanks,   True Mar, MD, PhD [facility])    ----- Message ----- From: Mar True, MD Sent: 11/20/2023   6:35 PM EDT To: True Mar, MD

## 2023-11-22 ENCOUNTER — Other Ambulatory Visit: Payer: Self-pay | Admitting: Medical Genetics

## 2023-11-25 NOTE — Telephone Encounter (Signed)
 Order sent to dme DME [facility]  [phone]

## 2023-12-09 ENCOUNTER — Other Ambulatory Visit (HOSPITAL_COMMUNITY): Payer: Self-pay

## 2023-12-09 ENCOUNTER — Ambulatory Visit: Attending: Cardiovascular Disease | Admitting: Pharmacist

## 2023-12-09 VITALS — BP 168/96 | HR 93

## 2023-12-09 DIAGNOSIS — I10 Essential (primary) hypertension: Secondary | ICD-10-CM

## 2023-12-09 MED ORDER — FLUZONE 0.5 ML IM SUSY
0.5000 mL | PREFILLED_SYRINGE | Freq: Once | INTRAMUSCULAR | 0 refills | Status: AC
  Filled 2023-12-09: qty 0.5, 1d supply, fill #0

## 2023-12-09 NOTE — Progress Notes (Signed)
 Patient ID: Alice Silva                 DOB: 1970/05/23                      MRN: [id number]     HPI: Alice Silva is a 53 y.o. female referred by Alice Silva to HTN clinic. Patient of Dr Alice Silva. PMH is significant for HTN, sleep apnea, and obesity.   Patient reported BP readings in late August/early September which were reduced from previous but remained above goal: 8/25 @ 1425... 138/93, HR 99  8/27 @ 1705... 160/86, HR 73  8/28 @ 1723... 141/81, HR 83 8/29 @ 0840... 138/78, HR 80 8/30 @ 1951... 140/78, HR 87 9/3 @ 2218... 150/82, HR 94 9/4 @ 0806... 138/80, HR 83 9/5 @ 0808... 134/80, HR 89  Currently managed on metoprolol  succinate 150mg  once daily and olmesartan  40mg  once daily with no patient reported adverse effects. She is hopeful to not start new medications.  In discussion for weight loss surgery but now she is considering not going through with it. Inquires about Zepbound for OSA. Is currently waiting for delivery of her CPAP machine.   Current HTN meds:  Olmesartan  40mg  daily Metoprolol  100mg  in the morning Metoprolol  50mg  in evening  BP goal: <130/80   Wt Readings from Last 3 Encounters:  10/14/23 (!) 449 lb (203.7 kg)  09/24/23 (!) 449 lb (203.7 kg)  09/10/23 (!) 444 lb (201.4 kg)   BP Readings from Last 3 Encounters:  10/14/23 (!) 138/93  09/10/23 (!) 160/90  05/20/23 (!) 138/90   Pulse Readings from Last 3 Encounters:  10/14/23 99  09/10/23 86  05/20/23 (!) 116    Renal function: CrCl cannot be calculated (Patient's most recent lab result is older than the maximum 21 days allowed.).  Past Medical History:  Diagnosis Date   Arthritis    GERD with esophagitis    History of gallstones    Hypertension    Left breast mass    Osteoarthritis    Perimenopause    Prolactinoma (HCC)     Current Outpatient Medications on File Prior to Visit  Medication Sig Dispense Refill   Aspirin-Acetaminophen-Caffeine (EXCEDRIN PO) Take by mouth as needed. generic      clobetasol  ointment (TEMOVATE ) 0.05 % Apply 1 Application topically 2 (two) times daily. (Patient taking differently: Apply 1 Application topically 2 (two) times daily. As needed) 60 g 2   cyclobenzaprine  (FLEXERIL ) 10 MG tablet Take 1 tablet (10 mg total) by mouth 3 (three) times daily as needed. 30 tablet 0   FIBER PO Take by mouth.     fluticasone (FLONASE) 50 MCG/ACT nasal spray Place 2 sprays into the nose as needed for allergies.     Garlic 1000 MG CAPS  (Patient taking differently: 5,000 mg daily.)     ibuprofen (ADVIL) 200 MG tablet Take 400-600 mg by mouth as needed.     loratadine (CLARITIN) 10 MG tablet Take 10 mg by mouth as needed for allergies.     metoprolol  succinate (TOPROL  XL) 100 MG 24 hr tablet Take 1 tablet (100 mg total) by mouth every morning. 90 tablet 3   metoprolol  succinate (TOPROL  XL) 50 MG 24 hr tablet Take 1 tablet (50 mg total) by mouth at bedtime. ( This is in addition to taking 100mg  in AM) 90 tablet 3   Multiple Vitamin (MULTIVITAMIN ADULT PO)      Nutritional Supplements (VITAMIN D MAINTENANCE  PO)      olmesartan  (BENICAR ) 40 MG tablet Take 1 tablet (40 mg total) by mouth daily. 90 tablet 3   Omega-3 1000 MG CAPS Take by mouth daily at 6 (six) AM.     Probiotic Product (PROBIOTIC PEARLS) CAPS      Vitamin D-Vitamin K (D3 + K2 PO) Take by mouth. D3 10,000 int'l units, K2 200 mcg daily     No current facility-administered medications on file prior to visit.    Allergies  Allergen Reactions   Ace Inhibitors Cough    Note: consistent cough      Assessment/Plan:  1. Hypertension -  Patient BP in room 182/137, rechecked later at 168/96. Both are above goal of <130/80. Sleep apnea likely contributory. Recommended she continue to check BP at home especially once she begins her CPAP. She does not wish to start new medications at this time however they may be needed if readings to do not decrease. Will complete PA for Zepbound per patient  request.  Continue: Olmesartan  40mg  daily Metoprolol  100mg  in the morning Metoprolol  50mg  in evening Continue to monitor at home  Alice Silva, PharmD, Alice Silva, CDCES, CPP Alice Silva [address] [place], [place] [address] Phone: [phone]; Fax: [phone] 12/24/2023 11:54 AM

## 2023-12-09 NOTE — Patient Instructions (Signed)
 It was nice meeting you today  We would like your blood pressure to be less than 130/80  Please continue your olmesartan  and metoprolol   Continue to monitor your BP at home once you get your CPAP machine  IF blood pressure remains elevated, we will likely have to add on another medication  I will also complete a PA for Zepbound for you  Please let us  know if you have  any questions  Medford Bolk, PharmD, BCACP, CDCES, CPP [facility] [address] [place], [place] [address] Phone: [phone]; Fax: [phone] 12/09/2023 10:59 AM

## 2023-12-16 ENCOUNTER — Other Ambulatory Visit (HOSPITAL_COMMUNITY): Payer: Self-pay

## 2023-12-16 ENCOUNTER — Ambulatory Visit: Admitting: Orthopedic Surgery

## 2023-12-17 ENCOUNTER — Encounter: Payer: Self-pay | Admitting: Neurology

## 2023-12-17 NOTE — Telephone Encounter (Signed)
 RE: new pap user severe osa Received: Today Zott, Glade Salt, Nena RAMAN, RN; Lavon Milling; Darrel Boyer Got It Thank you     Previous Messages    ----- Message ----- From: Salt Nena RAMAN, RN Sent: 12/17/2023   2:54 PM EDT To: Milling Donnice Boyer Darrel; Glade Zott Subject: new pap user severe osa                        New order in EPIC.  Alice Silva Female, 53 y.o., [birth year] MRN: [id number] Code: Not on file Employee Plan : [employer] ACP: None   Thanks,  Particia SAUNDERS

## 2023-12-19 ENCOUNTER — Encounter: Payer: Self-pay | Admitting: Pharmacist

## 2023-12-23 ENCOUNTER — Encounter: Payer: Self-pay | Admitting: Radiology

## 2023-12-24 ENCOUNTER — Telehealth: Payer: Self-pay | Admitting: Pharmacy Technician

## 2023-12-24 ENCOUNTER — Telehealth: Payer: Self-pay | Admitting: Pharmacist

## 2023-12-24 ENCOUNTER — Encounter: Payer: Self-pay | Admitting: Pharmacist

## 2023-12-24 ENCOUNTER — Other Ambulatory Visit (HOSPITAL_COMMUNITY): Payer: Self-pay

## 2023-12-24 NOTE — Telephone Encounter (Signed)
 Please complete PA for Zepbound, OSA diagnosis

## 2023-12-24 NOTE — Telephone Encounter (Signed)
 This drug/product is not covered under the pharmacy benefit. Prior Authorization is not available.

## 2023-12-24 NOTE — Telephone Encounter (Signed)
 Faxed to [facility] [phone] 19pgs new autopap user.  Pt request. Confirmation fax received.

## 2023-12-25 ENCOUNTER — Other Ambulatory Visit (HOSPITAL_COMMUNITY): Payer: Self-pay

## 2023-12-30 ENCOUNTER — Ambulatory Visit: Admitting: Orthopedic Surgery

## 2023-12-30 DIAGNOSIS — M170 Bilateral primary osteoarthritis of knee: Secondary | ICD-10-CM

## 2023-12-30 NOTE — Progress Notes (Unsigned)
   Procedure Note  Patient: Alice Silva             Date of Birth: 1970-04-19           MRN: [id number]             Visit Date: 12/30/2023  Procedures: Visit Diagnoses:  1. Primary osteoarthritis of both knees     Large Joint Inj: bilateral knee on 12/30/2023 5:19 PM Indications: pain, joint swelling and diagnostic evaluation Details: 18 G 1.5 in needle, superolateral approach  Arthrogram: No  Medications (Right): 88 mg Hyaluronan 88 MG/4ML Medications (Left): 88 mg Hyaluronan 88 MG/4ML Outcome: tolerated well, no immediate complications Procedure, treatment alternatives, risks and benefits explained, specific risks discussed. Consent was given by the patient. Immediately prior to procedure a time out was called to verify the correct patient, procedure, equipment, support staff and site/side marked as required. Patient was prepped and draped in the usual sterile fashion.     Lot #[id number]

## 2023-12-31 ENCOUNTER — Encounter: Payer: Self-pay | Admitting: Orthopedic Surgery

## 2023-12-31 ENCOUNTER — Other Ambulatory Visit

## 2023-12-31 MED ORDER — HYALURONAN 88 MG/4ML IX SOSY
88.0000 mg | PREFILLED_SYRINGE | INTRA_ARTICULAR | Status: AC | PRN
  Administered 2023-12-30: 88 mg via INTRA_ARTICULAR

## 2024-01-01 ENCOUNTER — Encounter: Payer: Self-pay | Admitting: Orthopedic Surgery

## 2024-01-02 ENCOUNTER — Other Ambulatory Visit: Payer: Self-pay | Admitting: Orthopedic Surgery

## 2024-01-02 ENCOUNTER — Other Ambulatory Visit (HOSPITAL_COMMUNITY): Payer: Self-pay

## 2024-01-02 MED ORDER — ACETAMINOPHEN-CODEINE 300-30 MG PO TABS
1.0000 | ORAL_TABLET | ORAL | 0 refills | Status: AC | PRN
  Filled 2024-01-02 – 2024-01-30 (×3): qty 30, 5d supply, fill #0

## 2024-01-03 ENCOUNTER — Encounter (HOSPITAL_COMMUNITY): Payer: Self-pay

## 2024-01-06 ENCOUNTER — Ambulatory Visit: Admitting: Surgical

## 2024-01-06 ENCOUNTER — Ambulatory Visit: Admitting: Orthopedic Surgery

## 2024-01-07 ENCOUNTER — Other Ambulatory Visit (HOSPITAL_COMMUNITY): Payer: Self-pay

## 2024-01-07 NOTE — Telephone Encounter (Signed)
 Called [employer] at [phone] to check on status. Spoke w/ Artanda. She was able to locate pt order but had difficulty as it was not in their system. She pulled and sent to [facility] who works with respiratory therapist to get pt set up. I expressed this is urgent and asked it be expedited if possible. She verbalized understanding.

## 2024-01-09 ENCOUNTER — Other Ambulatory Visit (HOSPITAL_COMMUNITY): Payer: Self-pay

## 2024-01-09 ENCOUNTER — Encounter (HOSPITAL_COMMUNITY): Payer: Self-pay

## 2024-01-10 ENCOUNTER — Other Ambulatory Visit: Payer: Self-pay

## 2024-01-17 ENCOUNTER — Other Ambulatory Visit (HOSPITAL_COMMUNITY): Payer: Self-pay

## 2024-01-20 ENCOUNTER — Other Ambulatory Visit (HOSPITAL_COMMUNITY): Payer: Self-pay

## 2024-01-27 ENCOUNTER — Other Ambulatory Visit: Payer: Self-pay

## 2024-01-30 ENCOUNTER — Other Ambulatory Visit (HOSPITAL_BASED_OUTPATIENT_CLINIC_OR_DEPARTMENT_OTHER): Payer: Self-pay

## 2024-02-05 ENCOUNTER — Encounter: Payer: Self-pay | Admitting: Orthopedic Surgery

## 2024-02-06 NOTE — Telephone Encounter (Signed)
 Ok to come in today or Friday thx

## 2024-02-07 ENCOUNTER — Encounter: Payer: Self-pay | Admitting: Neurology

## 2024-02-15 ENCOUNTER — Other Ambulatory Visit: Payer: Self-pay

## 2024-02-18 ENCOUNTER — Encounter (HOSPITAL_COMMUNITY): Payer: Self-pay

## 2024-02-19 ENCOUNTER — Other Ambulatory Visit: Payer: Self-pay

## 2024-02-19 ENCOUNTER — Other Ambulatory Visit (HOSPITAL_COMMUNITY): Payer: Self-pay

## 2024-02-24 ENCOUNTER — Ambulatory Visit: Admitting: Orthopedic Surgery

## 2024-02-24 ENCOUNTER — Telehealth: Payer: Self-pay

## 2024-02-24 ENCOUNTER — Other Ambulatory Visit (INDEPENDENT_AMBULATORY_CARE_PROVIDER_SITE_OTHER)

## 2024-02-24 ENCOUNTER — Encounter: Payer: Self-pay | Admitting: Orthopedic Surgery

## 2024-02-24 DIAGNOSIS — M25561 Pain in right knee: Secondary | ICD-10-CM

## 2024-02-24 DIAGNOSIS — M25562 Pain in left knee: Secondary | ICD-10-CM | POA: Diagnosis not present

## 2024-02-24 DIAGNOSIS — M170 Bilateral primary osteoarthritis of knee: Secondary | ICD-10-CM | POA: Diagnosis not present

## 2024-02-24 DIAGNOSIS — M545 Low back pain, unspecified: Secondary | ICD-10-CM

## 2024-02-24 NOTE — Progress Notes (Signed)
 "  Office Visit Note   Patient: Alice Silva           Date of Birth: 1970-10-17           MRN: [id number] Visit Date: 02/24/2024 Requested by: Alice Piedra, NP [phone],  [place] [address] PCP: Alice Piedra, NP  Subjective: Chief Complaint  Patient presents with   Right Knee - Pain   Left Knee - Pain    HPI: Brette Cast is a 54 y.o. female who presents to the office reporting bilateral knee pain.  She had gel injection in November which did not help her and in fact made her worse for period of time.  Currently she is getting a little bit better from that.  Left knee hurts worse than the right.  Cannot walk more than 100 steps.  She also has history of low back pain which causes her to walk in a somewhat flexed position in her hips..                ROS: All systems reviewed are negative as they relate to the chief complaint within the history of present illness.  Patient denies fevers or chills.  Assessment & Plan: Visit Diagnoses:  1. Pain in both knees, unspecified chronicity   2. Primary osteoarthritis of both knees   3. Low back pain, unspecified back pain laterality, unspecified chronicity, unspecified whether sciatica present     Plan: Impression is end-stage and worsening arthritis in a 54 year old patient with severe arthritis on plain radiographs.  Injections are giving diminishing returns.  We will try her in physical therapy for low back pain and bilateral knee arthritis as this has helped her in the past.  I think she is heading for knee replacement at sometime in the future.  In the meantime we will send her to a physician for consideration of nerve ablation around the knee for pain control until she is a candidate for total knee replacement.  She may follow-up with us  in a month or 2 for consideration of Toradol injections or potentially cortisone injections.  We would likely go through anterolateral approach for that injection with adequate  numbing on both sides.  Follow-Up Instructions: No follow-ups on file.   Orders:  Orders Placed This Encounter  Procedures   XR Knee 1-2 Views Right   XR Knee 1-2 Views Left   Ambulatory referral to Physical Therapy   AMB referral to sports medicine   No orders of the defined types were placed in this encounter.     Procedures: No procedures performed   Clinical Data: No additional findings.  Objective: Vital Signs: There were no vitals taken for this visit.  Physical Exam:  Constitutional: Patient appears well-developed HEENT:  Head: Normocephalic Eyes:EOM are normal Neck: Normal range of motion Cardiovascular: Normal rate Pulmonary/chest: Effort normal Neurologic: Patient is alert Skin: Skin is warm Psychiatric: Patient has normal mood and affect  Ortho Exam: Ortho exam demonstrates varus alignment in bilateral lower extremities extensor mechanism is intact.  Range of motion is about 10 to 15 degree flexion contracture in both knees to 70 degrees of flexion.  No groin pain with internal/external rotation of the leg.  Both feet are perfused and sensate  Specialty Comments:  No specialty comments available.  Imaging: No results found.   PMFS History: Patient Active Problem List   Diagnosis Date Noted   Osteoarthritis of both knees 09/20/2020   Chronic back pain 06/27/2020  History of prolactinoma 06/27/2020   Pain in both knees 10/29/2018   Primary osteoarthritis of both knees 10/29/2018   LAP-BAND surgery status 03/12/2018   Pituitary microadenoma (HCC) 02/26/2018   Vitamin D deficiency 02/26/2018   Essential hypertension 03/24/2013   Morbid obesity (HCC) 03/24/2013   Uterine leiomyoma 03/24/2013   Past Medical History:  Diagnosis Date   Arthritis    GERD with esophagitis    History of gallstones    Hypertension    Left breast mass    Osteoarthritis    Perimenopause    Prolactinoma (HCC)     Family History  Problem Relation Age of Onset    Hypertension Mother    High blood pressure Mother    Hyperlipidemia Mother    Depression Mother    Obesity Mother    Hypertension Father    Hyperlipidemia Father    High blood pressure Father    Obesity Father    Alcohol  abuse Sister        cirrhosis   Cancer Maternal Grandmother 105       pancreatic   Pancreatic cancer Maternal Grandmother    Stroke Paternal Grandmother    Heart disease Paternal Grandmother 97       MI   Dementia Maternal Uncle 31   Cancer Paternal Aunt 110       Breast   Asthma Daughter    Sleep apnea Neg Hx     Past Surgical History:  Procedure Laterality Date   BREAST BIOPSY Left 10/22/2023   US  LT BREAST BX W LOC DEV 1ST LESION IMG BX SPEC US  GUIDE 10/22/2023 [facility] MAMMOGRAPHY   CERVICAL BIOPSY  W/ LOOP ELECTRODE EXCISION  2019   LAPAROSCOPIC GASTRIC BANDING  2012   TONSILLECTOMY AND ADENOIDECTOMY     age 16   TUBAL LIGATION     Social History   Occupational History    Comment: RN- ICU  Tobacco Use   Smoking status: Never   Smokeless tobacco: Never  Vaping Use   Vaping status: Never Used  Substance and Sexual Activity   Alcohol  use: Never    Comment: social   Drug use: Never   Sexual activity: Yes    Birth control/protection: Surgical    Comment: tubal ligation        "

## 2024-02-24 NOTE — Telephone Encounter (Signed)
 Referral needed to either Dr Burnetta for bil knee possible ablation

## 2024-02-25 ENCOUNTER — Other Ambulatory Visit: Payer: Self-pay | Admitting: Medical Genetics

## 2024-02-25 DIAGNOSIS — Z006 Encounter for examination for normal comparison and control in clinical research program: Secondary | ICD-10-CM

## 2024-03-02 ENCOUNTER — Ambulatory Visit: Admitting: Orthopedic Surgery

## 2024-03-09 ENCOUNTER — Ambulatory Visit: Admitting: Orthopedic Surgery

## 2024-03-16 LAB — GENECONNECT MOLECULAR SCREEN: Genetic Analysis Overall Interpretation: NEGATIVE

## 2024-03-25 ENCOUNTER — Encounter: Payer: Self-pay | Admitting: Orthopedic Surgery

## 2024-03-27 ENCOUNTER — Other Ambulatory Visit: Payer: Self-pay

## 2024-03-27 DIAGNOSIS — M7989 Other specified soft tissue disorders: Secondary | ICD-10-CM

## 2024-04-06 ENCOUNTER — Other Ambulatory Visit (HOSPITAL_COMMUNITY)
